# Patient Record
Sex: Female | Born: 1955 | Race: White | Hispanic: No | Smoking: Current every day smoker
Health system: Southern US, Community
[De-identification: ages and names within clinical notes are randomized; demographics above are authoritative.]

## PROBLEM LIST (undated history)

## (undated) DIAGNOSIS — F329 Major depressive disorder, single episode, unspecified: Secondary | ICD-10-CM

## (undated) DIAGNOSIS — R519 Headache, unspecified: Secondary | ICD-10-CM

## (undated) DIAGNOSIS — N951 Menopausal and female climacteric states: Secondary | ICD-10-CM

## (undated) DIAGNOSIS — E7800 Pure hypercholesterolemia, unspecified: Secondary | ICD-10-CM

## (undated) DIAGNOSIS — Z972 Presence of dental prosthetic device (complete) (partial): Secondary | ICD-10-CM

## (undated) DIAGNOSIS — E559 Vitamin D deficiency, unspecified: Secondary | ICD-10-CM

## (undated) DIAGNOSIS — F32A Depression, unspecified: Secondary | ICD-10-CM

## (undated) DIAGNOSIS — A630 Anogenital (venereal) warts: Secondary | ICD-10-CM

## (undated) DIAGNOSIS — N9089 Other specified noninflammatory disorders of vulva and perineum: Secondary | ICD-10-CM

## (undated) DIAGNOSIS — F419 Anxiety disorder, unspecified: Secondary | ICD-10-CM

## (undated) HISTORY — DX: Other specified noninflammatory disorders of vulva and perineum: N90.89

## (undated) HISTORY — DX: Vitamin D deficiency, unspecified: E55.9

## (undated) HISTORY — DX: Pure hypercholesterolemia, unspecified: E78.00

## (undated) HISTORY — PX: HERNIA REPAIR: SHX51

## (undated) HISTORY — PX: TUBAL LIGATION: SHX77

## (undated) HISTORY — DX: Anxiety disorder, unspecified: F41.9

## (undated) HISTORY — DX: Depression, unspecified: F32.A

## (undated) HISTORY — DX: Major depressive disorder, single episode, unspecified: F32.9

## (undated) HISTORY — DX: Menopausal and female climacteric states: N95.1

---
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
## 2005-05-19 ENCOUNTER — Ambulatory Visit: Payer: Self-pay

## 2006-06-29 ENCOUNTER — Ambulatory Visit: Payer: Self-pay

## 2007-07-03 ENCOUNTER — Ambulatory Visit: Payer: Self-pay

## 2008-07-07 ENCOUNTER — Ambulatory Visit: Payer: Self-pay

## 2009-07-09 ENCOUNTER — Ambulatory Visit: Payer: Self-pay

## 2010-12-09 ENCOUNTER — Ambulatory Visit: Payer: Self-pay | Admitting: Unknown Physician Specialty

## 2013-02-07 LAB — HM PAP SMEAR: HM Pap smear: NEGATIVE

## 2013-02-25 ENCOUNTER — Ambulatory Visit: Payer: Self-pay | Admitting: Obstetrics and Gynecology

## 2013-11-21 IMAGING — MG MM CAD SCREENING MAMMO
1 series · 4 of 4 positions shown · non-contrast
Comparison: none

REASON FOR EXAM: scr mammo no order
COMMENTS:

[R CC · right · 4 of 4 slices shown]
[im 1/4]
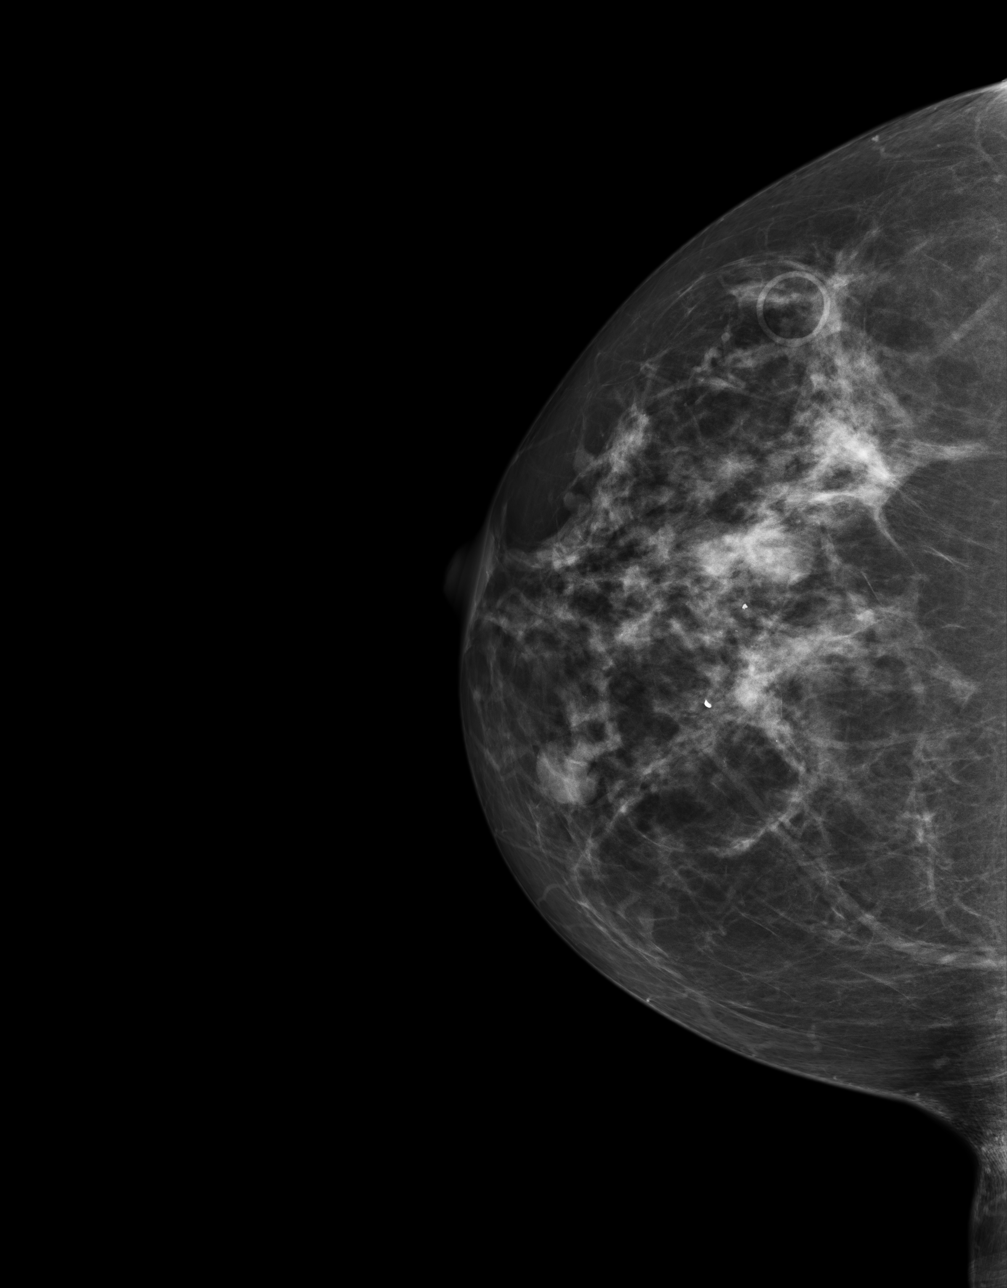
[im 2/4]
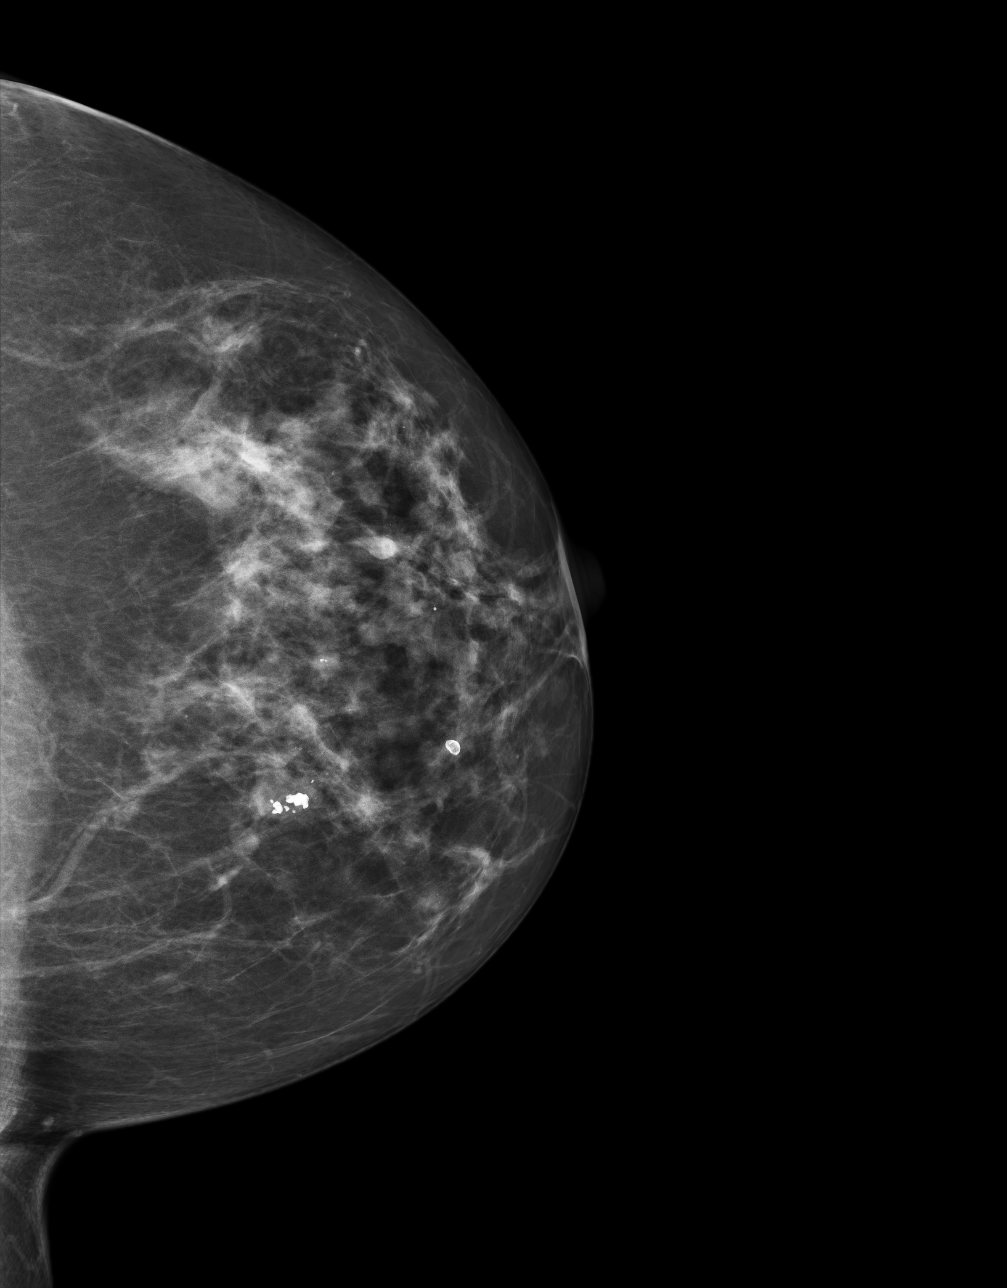
[im 3/4]
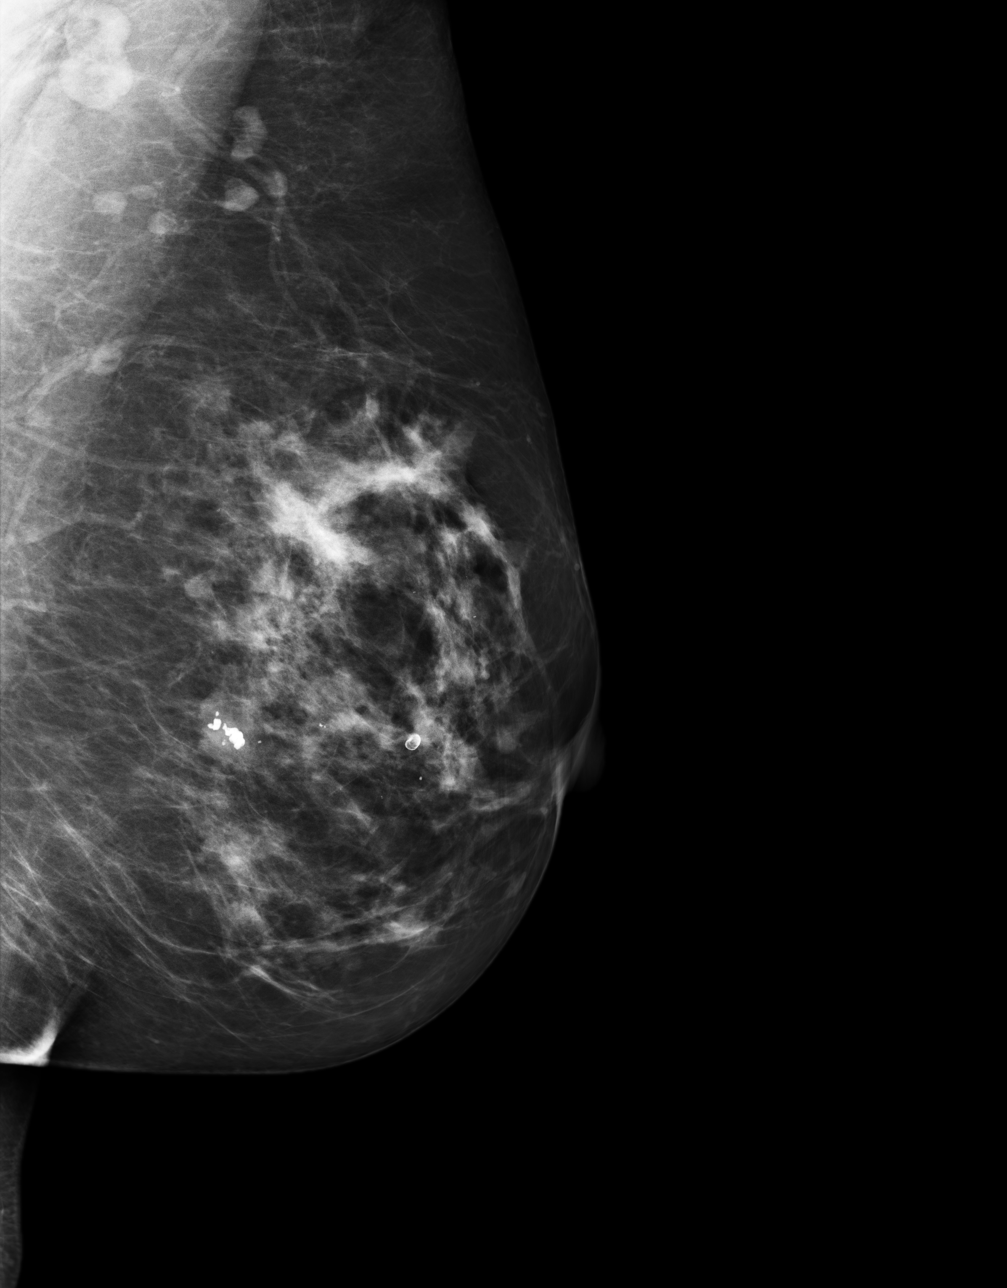
[im 4/4]
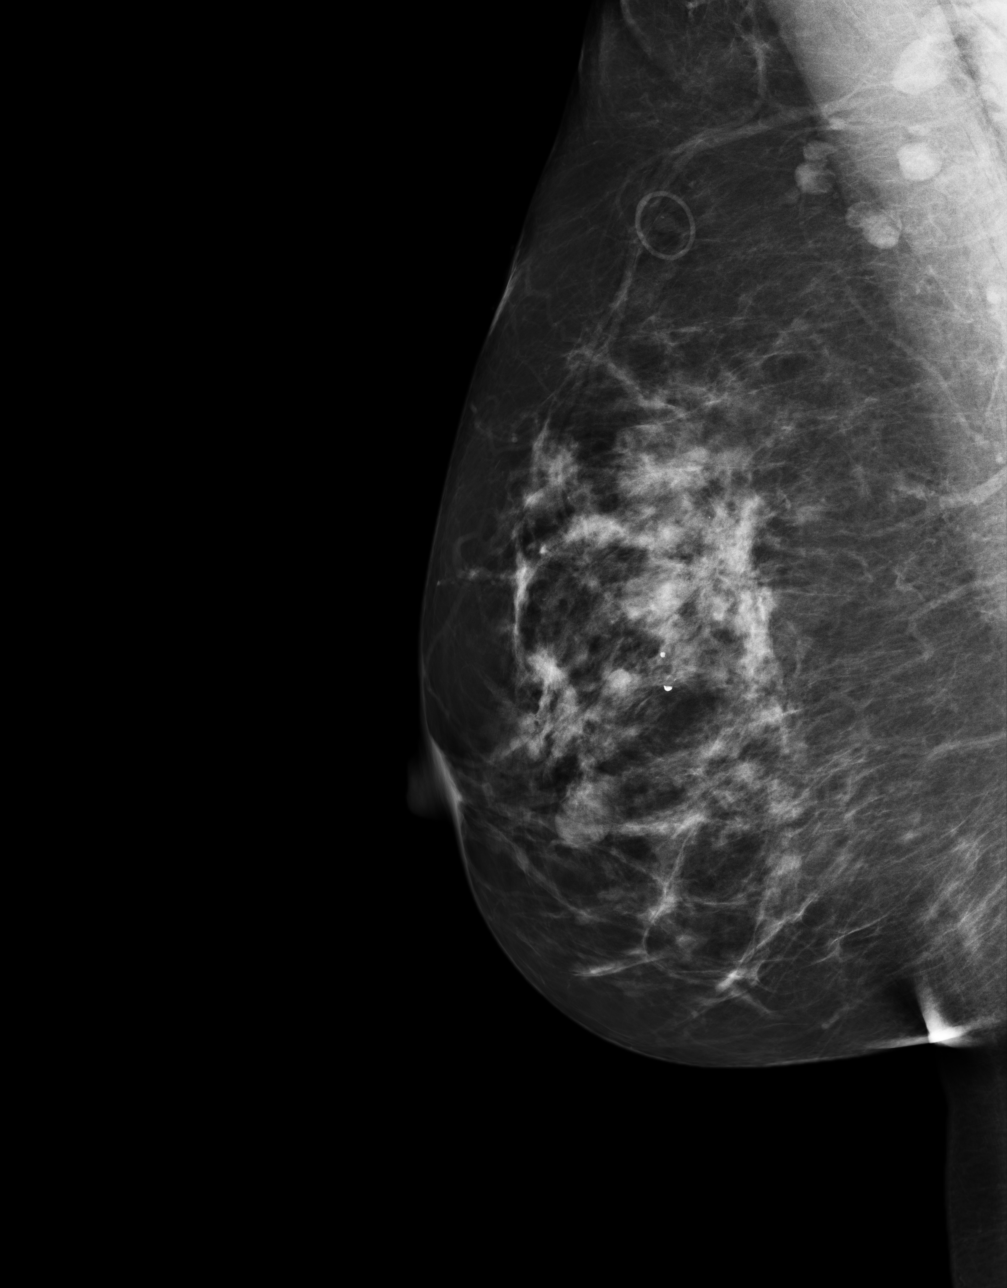

[4 of 4 positions shown; findings below may reference images not displayed]

PROCEDURE:     MAM - MAM DGTL SCRN MAM NO ORDER W/CAD  - February 25, 2013  [DATE]

RESULT:     There is no personal or family history of breast cancer. The
patient denies previous breast surgery. Comparison is made to previous
digital exams including those from to December 2010, too July 2009 and
03 July, 2007. There are benign appearing microcalcifications. There is a
heterogeneously dense pattern of parenchyma bilaterally which appears to be
stable. There is a skin nevus marker laterally in the upper outer mid to
posterior right breast. Axillary lymph nodes appear to be unchanged. Some
nodular regions are seen within the breasts themselves and also appear
unchanged. Some appear to be smaller especially in the right breast.
IMPRESSION: Benign appearing bilateral mammogram. Please continue to
encourage annual mammographic followup and monthly breast self exam. BREAST
COMPOSITION: The breast composition is HETEROGENEOUSLY DENSE (glandular
tissue is 51-75%) This may decrease the sensitivity of mammography.
BI-RADS: Category 2- Benign Finding

A NEGATIVE MAMMOGRAM REPORT DOES NOT PRECLUDE BIOPSY OR OTHER EVALUATION OF
A CLINICALLY PALPABLE OR OTHERWISE SUSPICIOUS MASS OR LESION. BREAST CANCER
MAY NOT BE DETECTED IN UP TO 10% OF CASES.

[REDACTED]

## 2015-02-17 ENCOUNTER — Ambulatory Visit
Admit: 2015-02-17 | Disposition: A | Discharge status: Home / Self Care | Payer: Self-pay | Attending: Obstetrics and Gynecology | Admitting: Obstetrics and Gynecology

## 2015-08-06 ENCOUNTER — Other Ambulatory Visit (INDEPENDENT_AMBULATORY_CARE_PROVIDER_SITE_OTHER): Payer: 59 | Admitting: Obstetrics and Gynecology

## 2015-08-06 DIAGNOSIS — R300 Dysuria: Secondary | ICD-10-CM | POA: Diagnosis not present

## 2015-08-06 MED ORDER — CIPROFLOXACIN HCL 250 MG PO TABS: 250.0000 mg | ORAL_TABLET | Freq: Two times a day (BID) | ORAL | Status: DC

## 2015-08-06 NOTE — Progress Notes (Unsigned)
Pt came in c/o burning and pressure when urinating States she doesn't want to go all weekend without medication  Cipro was sent in, culture was added

## 2015-08-08 LAB — URINE CULTURE

## 2015-08-10 ENCOUNTER — Telehealth: Payer: Self-pay | Admitting: Obstetrics and Gynecology

## 2015-08-10 NOTE — Telephone Encounter (Signed)
Pt was here last Thursday and dropped off a urine sample she was given an antibiotic, she ended it yesterday and she does feel better, she wanted to know the results of her culture and watned to know if she needs to get another antibiotic or what she took is plenty.

## 2015-08-11 ENCOUNTER — Other Ambulatory Visit: Payer: Self-pay | Admitting: *Deleted

## 2015-08-11 ENCOUNTER — Other Ambulatory Visit: Payer: Self-pay | Admitting: Obstetrics and Gynecology

## 2015-08-11 MED ORDER — CIPROFLOXACIN HCL 250 MG PO TABS: 250.0000 mg | ORAL_TABLET | Freq: Two times a day (BID) | ORAL | Status: DC

## 2015-08-11 NOTE — Telephone Encounter (Signed)
Left detailed message about pts urine culture

## 2015-08-11 NOTE — Telephone Encounter (Signed)
Please see previous note.

## 2015-08-11 NOTE — Telephone Encounter (Signed)
-----   Message from Evonnie Pat, [place] sent at 08/11/2015  1:38 PM EDT ----- Please let her know UTI was + E coli and current antibiotic showed it would treat it. To finish all medication and make sure she is starting to feel better

## 2015-08-11 NOTE — Telephone Encounter (Signed)
Left detailed message about urine culture

## 2015-08-11 NOTE — Telephone Encounter (Signed)
pls advise

## 2015-12-16 ENCOUNTER — Encounter: Payer: Self-pay | Admitting: *Deleted

## 2015-12-16 ENCOUNTER — Encounter: Payer: Self-pay | Admitting: Obstetrics and Gynecology

## 2015-12-16 ENCOUNTER — Ambulatory Visit (INDEPENDENT_AMBULATORY_CARE_PROVIDER_SITE_OTHER): Payer: 59 | Admitting: Obstetrics and Gynecology

## 2015-12-16 VITALS — BP 128/84 | HR 64 | Wt 137.1 lb

## 2015-12-16 DIAGNOSIS — M545 Low back pain: Secondary | ICD-10-CM | POA: Diagnosis not present

## 2015-12-16 DIAGNOSIS — N9489 Other specified conditions associated with female genital organs and menstrual cycle: Secondary | ICD-10-CM

## 2015-12-16 DIAGNOSIS — R102 Pelvic and perineal pain: Secondary | ICD-10-CM

## 2015-12-16 LAB — POCT URINALYSIS DIPSTICK
BILIRUBIN UA: NEGATIVE
Blood, UA: NEGATIVE
GLUCOSE UA: NEGATIVE
Ketones, UA: NEGATIVE
LEUKOCYTES UA: NEGATIVE
NITRITE UA: NEGATIVE
PH UA: 5
Protein, UA: NEGATIVE
Spec Grav, UA: 1.025
Urobilinogen, UA: 0.2

## 2015-12-16 MED ORDER — CIPROFLOXACIN HCL 500 MG PO TABS: 250.0000 mg | ORAL_TABLET | Freq: Two times a day (BID) | ORAL | Status: DC

## 2015-12-16 MED ORDER — CLONAZEPAM 0.5 MG PO TABS: 0.5000 mg | ORAL_TABLET | Freq: Two times a day (BID) | ORAL | Status: DC | PRN

## 2015-12-16 MED ORDER — UROGESIC-BLUE 81.6 MG PO TABS: 1.0000 | ORAL_TABLET | Freq: Four times a day (QID) | ORAL | Status: DC | PRN

## 2015-12-16 NOTE — Progress Notes (Signed)
Subjective:     Patient ID: Colleen Mcneil, female   DOB: [birth year], 60 y.o.   MRN: [id number]  HPI Lower pelvic bloating and pain progressively worsening x 10 days, especially last 24 hours, h/o UTIs, reports pain radiates through to low back. Better if legs elevated like in a recliner. Denies fever or dysuria  Review of Systems See above    Objective:   Physical Exam A&O x4  well groomed female in mild distress Blood pressure 128/84, pulse 64, weight 137 lb 1.6 oz (62.188 kg). Abdomen slightly tender over bladder, negative CVA Urinalysis    Component Value Date/Time   BILIRUBINUR neg 12/16/2015 1618   PROTEINUR neg 12/16/2015 1618   UROBILINOGEN 0.2 12/16/2015 1618   NITRITE neg 12/16/2015 1618   LEUKOCYTESUR Negative 12/16/2015 1618    Pelvic exam: normal external genitalia, vulva, vagina, cervix, uterus and adnexa, bladder tender on palpation.    Assessment:     Pelvic pain and pressure with radiation to low back     Plan:     RX for Cipro 500mg  bid x 7d, and urogesic Blue samples given, to stop sodas and increase water intake. Urine sent for culture, will call with results.  RTC prn  Melody Gresham, CNM

## 2015-12-19 LAB — URINE CULTURE: ORGANISM ID, BACTERIA: NO GROWTH

## 2015-12-21 ENCOUNTER — Telehealth: Payer: Self-pay | Admitting: *Deleted

## 2015-12-21 ENCOUNTER — Other Ambulatory Visit: Payer: Self-pay | Admitting: *Deleted

## 2015-12-21 MED ORDER — FLUCONAZOLE 150 MG PO TABS: 150.0000 mg | ORAL_TABLET | Freq: Once | ORAL | Status: DC

## 2015-12-21 NOTE — Telephone Encounter (Signed)
Pt called she is having pain in her low back pain, B pelvic pain, stopped Cipro 12/18/15 Wants to know what to do??

## 2015-12-22 NOTE — Telephone Encounter (Signed)
i am out of recommendation. I think she may need to be evaluated by ortho or PCP?

## 2015-12-28 NOTE — Telephone Encounter (Signed)
Notified pt she will check around for PCP

## 2016-02-23 ENCOUNTER — Encounter: Payer: Self-pay | Admitting: Obstetrics and Gynecology

## 2016-02-23 ENCOUNTER — Ambulatory Visit (INDEPENDENT_AMBULATORY_CARE_PROVIDER_SITE_OTHER): Payer: 59 | Admitting: Obstetrics and Gynecology

## 2016-02-23 VITALS — BP 138/75 | HR 74 | Ht 66.0 in | Wt 139.2 lb

## 2016-02-23 DIAGNOSIS — Z01419 Encounter for gynecological examination (general) (routine) without abnormal findings: Secondary | ICD-10-CM | POA: Diagnosis not present

## 2016-02-23 MED ORDER — CLONAZEPAM 0.5 MG PO TABS: 0.5000 mg | ORAL_TABLET | Freq: Two times a day (BID) | ORAL | Status: DC | PRN

## 2016-02-23 NOTE — Progress Notes (Signed)
Subjective:   Colleen Mcneil is a 60 y.o. No obstetric history on file. Caucasian female here for a routine well-woman exam.  No LMP recorded. Patient is postmenopausal.    Current complaints: fatigue PCP: me       does desire labs  Social History: Sexual: heterosexual Marital Status: married Living situation: with spouse Occupation: unknown occupation Tobacco/alcohol: no tobacco use Illicit drugs: no history of illicit drug use  The following portions of the patient's history were reviewed and updated as appropriate: allergies, current medications, past family history, past medical history, past social history, past surgical history and problem list.  Past Medical History Past Medical History  Diagnosis Date  . Anxiety   . Depression   . Elevated cholesterol   . Vulvar lesion   . Menopause syndrome   . Vitamin D deficiency     Past Surgical History Past Surgical History  Procedure Laterality Date  . Hernia repair    . Cesarean section  1986  . Tubal ligation      Gynecologic History No obstetric history on file.  No LMP recorded. Patient is postmenopausal. Contraception: post menopausal status Last Pap: 2015. Results were: normal Last mammogram: 2016. Results were: normal with denisty  Obstetric History OB History  No data available    Current Medications Current Outpatient Prescriptions on File Prior to Visit  Medication Sig Dispense Refill  . ciprofloxacin (CIPRO) 500 MG tablet Take 0.5 tablets (250 mg total) by mouth 2 (two) times daily. (Patient not taking: Reported on 02/23/2016) 14 tablet 1  . fluconazole (DIFLUCAN) 150 MG tablet Take 1 tablet (150 mg total) by mouth once. (Patient not taking: Reported on 02/23/2016) 1 tablet 2  . Methen-Hyosc-Meth Blue-Na Phos (UROGESIC-BLUE) 81.6 MG TABS Take 1 tablet (81.6 mg total) by mouth 4 (four) times daily as needed. (Patient not taking: Reported on 02/23/2016) 30 tablet 1   No current facility-administered  medications on file prior to visit.    Review of Systems Patient denies any headaches, blurred vision, shortness of breath, chest pain, abdominal pain, problems with bowel movements, urination, or intercourse.  Objective:  BP 138/75 mmHg  Pulse 74  Ht 5\' 6"  (1.676 m)  Wt 139 lb 3.2 oz (63.141 kg)  BMI 22.48 kg/m2 Physical Exam  General:  Well developed, well nourished, no acute distress. She is alert and oriented x3. Skin:  Warm and dry Neck:  Midline trachea, no thyromegaly or nodules Cardiovascular: Regular rate and rhythm, no murmur heard Lungs:  Effort normal, all lung fields clear to auscultation bilaterally Breasts:  No dominant palpable mass, retraction, or nipple discharge Abdomen:  Soft, non tender, no hepatosplenomegaly or masses Pelvic:  External genitalia is normal in appearance.  The vagina is normal in appearance. The cervix is bulbous, no CMT.  Thin prep pap is not done . Uterus is felt to be normal size, shape, and contour.  No adnexal masses or tenderness noted. Extremities:  No swelling or varicosities noted Psych:  She has a normal mood and affect  Assessment:   Healthy well-woman exam Fatigue   Plan:   F/U 1 year for AE, or sooner if needed Mammogram scheduled or sooner if problems Colonoscopy scheduled or sooner if problems  Shayanne Gomm Rockney Ghee, CNM

## 2016-02-23 NOTE — Patient Instructions (Signed)
  Place annual gynecologic exam patient instructions here.  Thank you for enrolling in [facility]. Please follow the instructions below to securely access your online medical record. MyChart allows you to send messages to your doctor, view your test results, manage appointments, and more.   How Do I Sign Up? 1. In your Internet browser, go to [employer] and enter [email or web address]. 2. Click on the Sign Up Now link in the Sign In box. You will see the New Member Sign Up page. 3. Enter your MyChart Access Code exactly as it appears below. You will not need to use this code after you've completed the sign-up process. If you do not sign up before the expiration date, you must request a new code.  MyChart Access Code: [email or web address] Expires: 04/23/2016  9:42 AM  4. Enter your Social Security Number ([id number]) and Date of Birth (mm/dd/yyyy) as indicated and click Submit. You will be taken to the next sign-up page. 5. Create a MyChart ID. This will be your MyChart login ID and cannot be changed, so think of one that is secure and easy to remember. 6. Create a MyChart password. You can change your password at any time. 7. Enter your Password Reset Question and Answer. This can be used at a later time if you forget your password.  8. Enter your e-mail address. You will receive e-mail notification when new information is available in [facility]. 9. Click Sign Up. You can now view your medical record.   Additional Information Remember, MyChart is NOT to be used for urgent needs. For medical emergencies, dial 911.

## 2016-02-24 ENCOUNTER — Telehealth: Payer: Self-pay | Admitting: *Deleted

## 2016-02-24 ENCOUNTER — Other Ambulatory Visit: Payer: Self-pay | Admitting: Obstetrics and Gynecology

## 2016-02-24 LAB — COMPREHENSIVE METABOLIC PANEL
ALK PHOS: 54 IU/L (ref 39–117)
ALT: 11 IU/L (ref 0–32)
AST: 15 IU/L (ref 0–40)
Albumin/Globulin Ratio: 2 (ref 1.2–2.2)
Albumin: 4.3 g/dL (ref 3.5–5.5)
BUN/Creatinine Ratio: 16 (ref 9–23)
BUN: 10 mg/dL (ref 6–24)
Bilirubin Total: 0.3 mg/dL (ref 0.0–1.2)
CALCIUM: 9.2 mg/dL (ref 8.7–10.2)
CO2: 25 mmol/L (ref 18–29)
CREATININE: 0.61 mg/dL (ref 0.57–1.00)
Chloride: 104 mmol/L (ref 96–106)
GFR calc Af Amer: 115 mL/min/{1.73_m2} (ref >59)
GFR, EST NON AFRICAN AMERICAN: 100 mL/min/{1.73_m2} (ref >59)
Globulin, Total: 2.1 g/dL (ref 1.5–4.5)
Glucose: 96 mg/dL (ref 65–99)
POTASSIUM: 4.3 mmol/L (ref 3.5–5.2)
Sodium: 143 mmol/L (ref 134–144)
Total Protein: 6.4 g/dL (ref 6.0–8.5)

## 2016-02-24 LAB — VITAMIN B12: Vitamin B-12: 295 pg/mL (ref 211–946)

## 2016-02-24 LAB — LIPID PANEL
CHOL/HDL RATIO: 4.4 ratio (ref 0.0–4.4)
CHOLESTEROL TOTAL: 190 mg/dL (ref 100–199)
HDL: 43 mg/dL (ref >39)
LDL CALC: 120 mg/dL — AB (ref 0–99)
TRIGLYCERIDES: 134 mg/dL (ref 0–149)
VLDL Cholesterol Cal: 27 mg/dL (ref 5–40)

## 2016-02-24 LAB — VITAMIN D 25 HYDROXY (VIT D DEFICIENCY, FRACTURES): Vit D, 25-Hydroxy: 20.2 ng/mL — ABNORMAL LOW (ref 30.0–100.0)

## 2016-02-24 MED ORDER — VITAMIN D (ERGOCALCIFEROL) 1.25 MG (50000 UNIT) PO CAPS: 50000.0000 [IU] | ORAL_CAPSULE | ORAL | Status: DC

## 2016-02-24 NOTE — Telephone Encounter (Signed)
Mailed pt all info about lab results

## 2016-02-24 NOTE — Telephone Encounter (Signed)
-----   Message from Joylene Igo, [place] sent at 02/24/2016  3:05 PM EDT ----- Please mail info on vit D def

## 2016-03-29 ENCOUNTER — Telehealth: Payer: Self-pay

## 2016-03-29 ENCOUNTER — Other Ambulatory Visit: Payer: Self-pay

## 2016-03-29 NOTE — Telephone Encounter (Signed)
Gastroenterology Pre-Procedure Review  Request Date: 04/29/16 Requesting Physician:   PATIENT REVIEW QUESTIONS: The patient responded to the following health history questions as indicated:    1. Are you having any GI issues? no 2. Do you have a personal history of Polyps? no 3. Do you have a family history of Colon Cancer or Polyps? no 4. Diabetes Mellitus? no 5. Joint replacements in the past 12 months?no 6. Major health problems in the past 3 months?no 7. Any artificial heart valves, MVP, or defibrillator?no    MEDICATIONS & ALLERGIES:    Patient reports the following regarding taking any anticoagulation/antiplatelet therapy:   Plavix, Coumadin, Eliquis, Xarelto, Lovenox, Pradaxa, Brilinta, or Effient? no Aspirin? no  Patient confirms/reports the following medications:  Current Outpatient Prescriptions  Medication Sig Dispense Refill  . ciprofloxacin (CIPRO) 500 MG tablet Take 0.5 tablets (250 mg total) by mouth 2 (two) times daily. (Patient not taking: Reported on 02/23/2016) 14 tablet 1  . clonazePAM (KLONOPIN) 0.5 MG tablet Take 1 tablet (0.5 mg total) by mouth 2 (two) times daily as needed for anxiety. 60 tablet 2  . fluconazole (DIFLUCAN) 150 MG tablet Take 1 tablet (150 mg total) by mouth once. (Patient not taking: Reported on 02/23/2016) 1 tablet 2  . Methen-Hyosc-Meth Blue-Na Phos (UROGESIC-BLUE) 81.6 MG TABS Take 1 tablet (81.6 mg total) by mouth 4 (four) times daily as needed. (Patient not taking: Reported on 02/23/2016) 30 tablet 1  . Vitamin D, Ergocalciferol, (DRISDOL) 50000 units CAPS capsule Take 1 capsule (50,000 Units total) by mouth 2 (two) times a week. 30 capsule 1   No current facility-administered medications for this visit.    Patient confirms/reports the following allergies:  Allergies  Allergen Reactions  . Macrobid [Nitrofurantoin Monohyd Macro]     No orders of the defined types were placed in this encounter.    AUTHORIZATION INFORMATION Primary  Insurance: 1D#: Group #:  Secondary Insurance: 1D#: Group #:  SCHEDULE INFORMATION: Date: 04/29/16 Time: Location: [place]

## 2016-04-19 ENCOUNTER — Telehealth: Payer: Self-pay

## 2016-04-19 NOTE — Telephone Encounter (Signed)
Patient called stating that she had called her insurance company to make sure that her colonoscopy would be covered. She wanted to know if Dr. Allen Norris was in network with [facility], and I told her that he was. She also wanted to know that if her colonoscopy was set-up to be preventative, I told her that it was. She also wanted to make sure if her colonoscopy was still set-up for 04/29/2016, I told her that it was. Patient had no further questions or concerns.

## 2016-04-29 ENCOUNTER — Ambulatory Visit: Admit: 2016-04-29 | Payer: Self-pay | Admitting: Gastroenterology

## 2016-04-29 SURGERY — COLONOSCOPY WITH PROPOFOL
Anesthesia: Choice

## 2016-05-19 ENCOUNTER — Other Ambulatory Visit: Payer: Self-pay

## 2016-05-19 ENCOUNTER — Encounter: Payer: Self-pay | Admitting: *Deleted

## 2016-05-20 ENCOUNTER — Ambulatory Visit: Payer: Commercial Managed Care - HMO | Admitting: Anesthesiology

## 2016-05-20 ENCOUNTER — Ambulatory Visit
Admission: RE | Admit: 2016-05-20 | Discharge: 2016-05-20 | Disposition: A | Discharge status: Home / Self Care | Payer: Commercial Managed Care - HMO | Source: Ambulatory Visit | Attending: Gastroenterology | Admitting: Gastroenterology

## 2016-05-20 ENCOUNTER — Encounter
Admission: RE | Disposition: A | Discharge status: Home / Self Care | Payer: Self-pay | Source: Ambulatory Visit | Attending: Gastroenterology

## 2016-05-20 DIAGNOSIS — Z881 Allergy status to other antibiotic agents status: Secondary | ICD-10-CM | POA: Insufficient documentation

## 2016-05-20 DIAGNOSIS — F419 Anxiety disorder, unspecified: Secondary | ICD-10-CM | POA: Diagnosis not present

## 2016-05-20 DIAGNOSIS — Z9889 Other specified postprocedural states: Secondary | ICD-10-CM | POA: Diagnosis not present

## 2016-05-20 DIAGNOSIS — F17210 Nicotine dependence, cigarettes, uncomplicated: Secondary | ICD-10-CM | POA: Insufficient documentation

## 2016-05-20 DIAGNOSIS — Z1211 Encounter for screening for malignant neoplasm of colon: Secondary | ICD-10-CM | POA: Insufficient documentation

## 2016-05-20 DIAGNOSIS — Z9851 Tubal ligation status: Secondary | ICD-10-CM | POA: Diagnosis not present

## 2016-05-20 DIAGNOSIS — K640 First degree hemorrhoids: Secondary | ICD-10-CM | POA: Diagnosis not present

## 2016-05-20 DIAGNOSIS — Z833 Family history of diabetes mellitus: Secondary | ICD-10-CM | POA: Insufficient documentation

## 2016-05-20 DIAGNOSIS — K635 Polyp of colon: Secondary | ICD-10-CM | POA: Insufficient documentation

## 2016-05-20 DIAGNOSIS — D125 Benign neoplasm of sigmoid colon: Secondary | ICD-10-CM | POA: Insufficient documentation

## 2016-05-20 DIAGNOSIS — Z8249 Family history of ischemic heart disease and other diseases of the circulatory system: Secondary | ICD-10-CM | POA: Diagnosis not present

## 2016-05-20 DIAGNOSIS — F329 Major depressive disorder, single episode, unspecified: Secondary | ICD-10-CM | POA: Insufficient documentation

## 2016-05-20 DIAGNOSIS — Z79899 Other long term (current) drug therapy: Secondary | ICD-10-CM | POA: Insufficient documentation

## 2016-05-20 DIAGNOSIS — K5730 Diverticulosis of large intestine without perforation or abscess without bleeding: Secondary | ICD-10-CM | POA: Diagnosis not present

## 2016-05-20 DIAGNOSIS — E7800 Pure hypercholesterolemia, unspecified: Secondary | ICD-10-CM | POA: Insufficient documentation

## 2016-05-20 DIAGNOSIS — E559 Vitamin D deficiency, unspecified: Secondary | ICD-10-CM | POA: Diagnosis not present

## 2016-05-20 HISTORY — PX: COLONOSCOPY WITH PROPOFOL: SHX5780

## 2016-05-20 HISTORY — PX: POLYPECTOMY: SHX5525

## 2016-05-20 SURGERY — COLONOSCOPY WITH PROPOFOL
Anesthesia: Monitor Anesthesia Care | Wound class: Contaminated

## 2016-05-20 MED ORDER — LACTATED RINGERS IV SOLN
INTRAVENOUS | Status: DC
  Administered 2016-05-20: 08:00:00 via INTRAVENOUS

## 2016-05-20 MED ORDER — DEXAMETHASONE SODIUM PHOSPHATE 4 MG/ML IJ SOLN: 8.0000 mg | Freq: Once | INTRAMUSCULAR | Status: DC | PRN

## 2016-05-20 MED ORDER — FENTANYL CITRATE (PF) 100 MCG/2ML IJ SOLN
25.0000 ug | INTRAMUSCULAR | Status: DC | PRN
Start: 2016-05-20 — End: 2016-05-20

## 2016-05-20 MED ORDER — PROPOFOL 10 MG/ML IV BOLUS
INTRAVENOUS | Status: DC | PRN
  Administered 2016-05-20: 20 mg via INTRAVENOUS
  Administered 2016-05-20: 10 mg via INTRAVENOUS
  Administered 2016-05-20 (×2): 20 mg via INTRAVENOUS
  Administered 2016-05-20: 70 mg via INTRAVENOUS
  Administered 2016-05-20: 10 mg via INTRAVENOUS
  Administered 2016-05-20 (×2): 20 mg via INTRAVENOUS
  Administered 2016-05-20: 10 mg via INTRAVENOUS
  Administered 2016-05-20 (×2): 20 mg via INTRAVENOUS

## 2016-05-20 MED ORDER — LIDOCAINE HCL (CARDIAC) 20 MG/ML IV SOLN
INTRAVENOUS | Status: DC | PRN
  Administered 2016-05-20: 50 mg via INTRAVENOUS

## 2016-05-20 MED ORDER — ACETAMINOPHEN 325 MG PO TABS: 325.0000 mg | ORAL_TABLET | ORAL | Status: DC | PRN

## 2016-05-20 MED ORDER — OXYCODONE HCL 5 MG/5ML PO SOLN: 5.0000 mg | Freq: Once | ORAL | Status: DC | PRN

## 2016-05-20 MED ORDER — ACETAMINOPHEN 160 MG/5ML PO SOLN: 325.0000 mg | ORAL | Status: DC | PRN

## 2016-05-20 MED ORDER — STERILE WATER FOR IRRIGATION IR SOLN
Status: DC | PRN
  Administered 2016-05-20: 09:00:00

## 2016-05-20 MED ORDER — OXYCODONE HCL 5 MG PO TABS: 5.0000 mg | ORAL_TABLET | Freq: Once | ORAL | Status: DC | PRN

## 2016-05-20 SURGICAL SUPPLY — 23 items
CANISTER SUCT 1200ML W/VALVE (MISCELLANEOUS) ×4 IMPLANT
CLIP HMST 235XBRD CATH ROT (MISCELLANEOUS) IMPLANT
CLIP RESOLUTION 360 11X235 (MISCELLANEOUS)
FCP ESCP3.2XJMB 240X2.8X (MISCELLANEOUS)
FORCEPS BIOP RAD 4 LRG CAP 4 (CUTTING FORCEPS) ×4 IMPLANT
FORCEPS BIOP RJ4 240 W/NDL (MISCELLANEOUS)
FORCEPS ESCP3.2XJMB 240X2.8X (MISCELLANEOUS) IMPLANT
GOWN CVR UNV OPN BCK APRN NK (MISCELLANEOUS) ×4 IMPLANT
GOWN ISOL THUMB LOOP REG UNIV (MISCELLANEOUS) ×4
INJECTOR VARIJECT VIN23 (MISCELLANEOUS) IMPLANT
KIT DEFENDO VALVE AND CONN (KITS) IMPLANT
KIT ENDO PROCEDURE OLY (KITS) ×4 IMPLANT
MARKER SPOT ENDO TATTOO 5ML (MISCELLANEOUS) IMPLANT
PAD GROUND ADULT SPLIT (MISCELLANEOUS) IMPLANT
PROBE APC STR FIRE (PROBE) IMPLANT
RETRIEVER NET ROTH 2.5X230 LF (MISCELLANEOUS) ×4 IMPLANT
SNARE SHORT THROW 13M SML OVAL (MISCELLANEOUS) ×4 IMPLANT
SNARE SHORT THROW 30M LRG OVAL (MISCELLANEOUS) IMPLANT
SNARE SNG USE RND 15MM (INSTRUMENTS) IMPLANT
SPOT EX ENDOSCOPIC TATTOO (MISCELLANEOUS)
TRAP ETRAP POLY (MISCELLANEOUS) ×4 IMPLANT
VARIJECT INJECTOR VIN23 (MISCELLANEOUS)
WATER STERILE IRR 250ML POUR (IV SOLUTION) ×4 IMPLANT

## 2016-05-20 NOTE — Op Note (Signed)
[facility] Patient Name: Colleen Mcneil Procedure Date: 05/20/2016 8:21 AM MRN: [id number] Account #: [id number] Date of Birth: 1956/01/20 Admit Type: Outpatient Age: 60 Room: [facility] OR ROOM 01 Gender: Female Note Status: Finalized Procedure:            Colonoscopy Indications:          Screening for colorectal malignant neoplasm Providers:            Lucilla Lame MD, MD Referring MD:         Evonnie Pat (Referring MD) Medicines:            Propofol per Anesthesia Complications:        No immediate complications. Procedure:            Pre-Anesthesia Assessment:                       - Prior to the procedure, a History and Physical was                        performed, and patient medications and allergies were                        reviewed. The patient's tolerance of previous                        anesthesia was also reviewed. The risks and benefits of                        the procedure and the sedation options and risks were                        discussed with the patient. All questions were                        answered, and informed consent was obtained. Prior                        Anticoagulants: The patient has taken no previous                        anticoagulant or antiplatelet agents. ASA Grade                        Assessment: II - A patient with mild systemic disease.                        After reviewing the risks and benefits, the patient was                        deemed in satisfactory condition to undergo the                        procedure.                       After obtaining informed consent, the colonoscope was                        passed under direct vision. Throughout the procedure,  the patient's blood pressure, pulse, and oxygen                        saturations were monitored continuously. The was                        introduced through the anus and advanced to the the                         cecum, identified by appendiceal orifice and ileocecal                        valve. The colonoscopy was performed without                        difficulty. The patient tolerated the procedure well.                        The quality of the bowel preparation was excellent. Findings:      The perianal and digital rectal examinations were normal.      Six sessile polyps were found in the sigmoid colon. The polyps were 4 to       7 mm in size. These polyps were removed with a cold snare. Resection and       retrieval were complete.      Non-bleeding internal hemorrhoids were found during retroflexion. The       hemorrhoids were Grade I (internal hemorrhoids that do not prolapse).      Multiple small-mouthed diverticula were found in the sigmoid colon. Impression:           - Six 4 to 7 mm polyps in the sigmoid colon, removed                        with a cold snare. Resected and retrieved.                       - Non-bleeding internal hemorrhoids.                       - Diverticulosis in the sigmoid colon. Recommendation:       - Await pathology results.                       - Repeat colonoscopy in 5 years if polyp adenoma and 10                        years if hyperplastic Procedure Code(s):    --- Professional ---                       [phone], Colonoscopy, flexible; with removal of tumor(s),                        polyp(s), or other lesion(s) by snare technique Diagnosis Code(s):    --- Professional ---                       Z12.11, Encounter for screening for malignant neoplasm                        of colon  D12.5, Benign neoplasm of sigmoid colon CPT copyright 2016 American Medical Association. All rights reserved. The codes documented in this report are preliminary and upon coder review may  be revised to meet current compliance requirements. Lucilla Lame MD, MD 05/20/2016 8:59:16 AM This report has been signed electronically. Number of Addenda:  0 Note Initiated On: 05/20/2016 8:21 AM Scope Withdrawal Time: 0 hours 6 minutes 17 seconds  Total Procedure Duration: 0 hours 16 minutes 15 seconds       [facility]

## 2016-05-20 NOTE — Anesthesia Preprocedure Evaluation (Signed)
Anesthesia Evaluation  Patient identified by MRN, date of birth, ID band Patient awake    Reviewed: Allergy & Precautions, H&P , NPO status , Patient's Chart, lab work & pertinent test results, reviewed documented beta blocker date and time   Airway Mallampati: II  TM Distance: >3 FB Neck ROM: full    Dental no notable dental hx.    Pulmonary neg pulmonary ROS, Current Smoker,    Pulmonary exam normal breath sounds clear to auscultation       Cardiovascular Exercise Tolerance: Good negative cardio ROS   Rhythm:regular Rate:Normal     Neuro/Psych negative neurological ROS  negative psych ROS   GI/Hepatic negative GI ROS, Neg liver ROS,   Endo/Other  negative endocrine ROS  Renal/GU negative Renal ROS  negative genitourinary   Musculoskeletal   Abdominal   Peds  Hematology negative hematology ROS (+)   Anesthesia Other Findings   Reproductive/Obstetrics negative OB ROS                             Anesthesia Physical Anesthesia Plan  ASA: I  Anesthesia Plan: MAC   Post-op Pain Management:    Induction:   Airway Management Planned:   Additional Equipment:   Intra-op Plan:   Post-operative Plan:   Informed Consent: I have reviewed the patients History and Physical, chart, labs and discussed the procedure including the risks, benefits and alternatives for the proposed anesthesia with the patient or authorized representative who has indicated his/her understanding and acceptance.     Plan Discussed with: CRNA  Anesthesia Plan Comments:         Anesthesia Quick Evaluation

## 2016-05-20 NOTE — Transfer of Care (Signed)
Immediate Anesthesia Transfer of Care Note  Patient: Colleen Mcneil  Procedure(s) Performed: Procedure(s): COLONOSCOPY WITH PROPOFOL (N/A) POLYPECTOMY  Patient Location: PACU  Anesthesia Type: MAC  Level of Consciousness: awake, alert  and patient cooperative  Airway and Oxygen Therapy: Patient Spontanous Breathing and Patient connected to supplemental oxygen  Post-op Assessment: Post-op Vital signs reviewed, Patient's Cardiovascular Status Stable, Respiratory Function Stable, Patent Airway and No signs of Nausea or vomiting  Post-op Vital Signs: Reviewed and stable  Complications: No apparent anesthesia complications

## 2016-05-20 NOTE — Anesthesia Procedure Notes (Signed)
Procedure Name: MAC Performed by: Asiel Chrostowski Pre-anesthesia Checklist: Patient identified, Emergency Drugs available, Suction available, Timeout performed and Patient being monitored Patient Re-evaluated:Patient Re-evaluated prior to inductionOxygen Delivery Method: Nasal cannula Placement Confirmation: positive ETCO2       

## 2016-05-20 NOTE — Anesthesia Postprocedure Evaluation (Signed)
Anesthesia Post Note  Patient: Colleen Mcneil  Procedure(s) Performed: Procedure(s) (LRB): COLONOSCOPY WITH PROPOFOL (N/A) POLYPECTOMY  Patient location during evaluation: PACU Anesthesia Type: MAC Level of consciousness: awake and alert Pain management: pain level controlled Vital Signs Assessment: post-procedure vital signs reviewed and stable Respiratory status: spontaneous breathing, nonlabored ventilation and respiratory function stable Cardiovascular status: blood pressure returned to baseline and stable Postop Assessment: no signs of nausea or vomiting Anesthetic complications: no    Valton Schwartz D Saifullah Jolley

## 2016-05-20 NOTE — Discharge Instructions (Signed)

## 2016-05-20 NOTE — H&P (Signed)
  Colleen Lame, MD [facility] [address], Colleen Mcneil, [place] [address] Phone: [phone] Fax : [phone]  Primary Care Physician:  [facility] Primary Gastroenterologist:  Dr. Allen Norris  Pre-Procedure History & Physical: HPI:  Colleen Mcneil is a 60 y.o. female is here for a screening colonoscopy.   Past Medical History  Diagnosis Date  . Anxiety   . Depression   . Elevated cholesterol   . Vulvar lesion   . Menopause syndrome   . Vitamin D deficiency     Past Surgical History  Procedure Laterality Date  . Hernia repair    . Cesarean section  1986  . Tubal ligation      Prior to Admission medications   Medication Sig Start Date End Date Taking? Authorizing Provider  clonazePAM (KLONOPIN) 0.5 MG tablet Take 1 tablet (0.5 mg total) by mouth 2 (two) times daily as needed for anxiety. 02/23/16  Yes Melody N Shambley, CNM  Vitamin D, Ergocalciferol, (DRISDOL) 50000 units CAPS capsule Take 1 capsule (50,000 Units total) by mouth 2 (two) times a week. 02/24/16  Yes Melody N Shambley, CNM    Allergies as of 05/19/2016 - Review Complete 05/19/2016  Allergen Reaction Noted  . Macrobid [nitrofurantoin monohyd macro] Nausea And Vomiting 08/06/2015    Family History  Problem Relation Age of Onset  . Heart disease Mother   . Diabetes Mother   . Diabetes Father     Social History   Social History  . Marital Status: Married    Spouse Name: N/A  . Number of Children: N/A  . Years of Education: N/A   Occupational History  . Not on file.   Social History Main Topics  . Smoking status: Current Every Day Smoker -- 0.50 packs/day for 25 years    Types: Cigarettes  . Smokeless tobacco: Never Used  . Alcohol Use: No  . Drug Use: No  . Sexual Activity: Not Currently   Other Topics Concern  . Not on file   Social History Narrative    Review of Systems: See HPI, otherwise negative ROS  Physical Exam: BP 102/71 mmHg  Pulse 65  Temp(Src)  97.7 F (36.5 C)  Resp 16  Ht 5\' 6"  (1.676 m)  Wt 135 lb (61.236 kg)  BMI 21.80 kg/m2  SpO2 99% General:   Alert,  pleasant and cooperative in NAD Head:  Normocephalic and atraumatic. Neck:  Supple; no masses or thyromegaly. Lungs:  Clear throughout to auscultation.    Heart:  Regular rate and rhythm. Abdomen:  Soft, nontender and nondistended. Normal bowel sounds, without guarding, and without rebound.   Neurologic:  Alert and  oriented x4;  grossly normal neurologically.  Impression/Plan: [name] is now here to undergo a screening colonoscopy.  Risks, benefits, and alternatives regarding colonoscopy have been reviewed with the patient.  Questions have been answered.  All parties agreeable.

## 2016-05-23 ENCOUNTER — Encounter: Payer: Self-pay | Admitting: Gastroenterology

## 2016-05-24 ENCOUNTER — Encounter: Payer: Self-pay | Admitting: Gastroenterology

## 2016-05-25 ENCOUNTER — Encounter: Payer: Self-pay | Admitting: Gastroenterology

## 2016-11-25 ENCOUNTER — Telehealth: Payer: Self-pay | Admitting: Obstetrics and Gynecology

## 2016-11-25 NOTE — Telephone Encounter (Signed)
THE PHARMACY SENT A REQUEST FOR MEDICATION, SHE NEEDS IT THIS W/E. YOU PROBABLY ALREADY HAVE IT.

## 2017-02-23 ENCOUNTER — Telehealth: Payer: Self-pay | Admitting: Obstetrics and Gynecology

## 2017-02-23 ENCOUNTER — Encounter: Payer: 59 | Admitting: Obstetrics and Gynecology

## 2017-02-23 NOTE — Telephone Encounter (Signed)
Per mad-- pt aware he will not refill at this time. [facility] will have to fill. Pt has an appt on 4/23.

## 2017-02-23 NOTE — Telephone Encounter (Signed)
Patient called stating she needs a refill on klonopin. She was scheduled to see Melody today but had to be rescheduled due to her being out. Thanks

## 2017-02-27 ENCOUNTER — Ambulatory Visit (INDEPENDENT_AMBULATORY_CARE_PROVIDER_SITE_OTHER): Payer: 59 | Admitting: Obstetrics and Gynecology

## 2017-02-27 ENCOUNTER — Other Ambulatory Visit: Payer: Self-pay | Admitting: Obstetrics and Gynecology

## 2017-02-27 ENCOUNTER — Encounter: Payer: Self-pay | Admitting: Obstetrics and Gynecology

## 2017-02-27 VITALS — BP 113/71 | HR 70 | Ht 66.0 in | Wt 133.5 lb

## 2017-02-27 DIAGNOSIS — L821 Other seborrheic keratosis: Secondary | ICD-10-CM | POA: Diagnosis not present

## 2017-02-27 DIAGNOSIS — Z01419 Encounter for gynecological examination (general) (routine) without abnormal findings: Secondary | ICD-10-CM

## 2017-02-27 DIAGNOSIS — E559 Vitamin D deficiency, unspecified: Secondary | ICD-10-CM

## 2017-02-27 DIAGNOSIS — F17200 Nicotine dependence, unspecified, uncomplicated: Secondary | ICD-10-CM | POA: Diagnosis not present

## 2017-02-27 MED ORDER — PODOFILOX 0.5 % EX GEL: Freq: Every evening | CUTANEOUS | 2 refills | Status: DC

## 2017-02-27 MED ORDER — CLONAZEPAM 1 MG PO TABS: 0.5000 mg | ORAL_TABLET | Freq: Two times a day (BID) | ORAL | 4 refills | Status: DC | PRN

## 2017-02-27 MED ORDER — VITAMIN D (ERGOCALCIFEROL) 1.25 MG (50000 UNIT) PO CAPS: 50000.0000 [IU] | ORAL_CAPSULE | ORAL | 1 refills | Status: DC

## 2017-02-27 MED ORDER — VITAMIN D3 125 MCG (5000 UT) PO CAPS: 1.0000 | ORAL_CAPSULE | Freq: Every day | ORAL | 2 refills | Status: DC

## 2017-02-27 NOTE — Progress Notes (Signed)
Subjective:   Colleen Mcneil is a 61 y.o. G2P3 Caucasian female here for a routine well-woman exam.  No LMP recorded. Patient is postmenopausal.    Current complaints: stress PCP: ?       does desire labs  Social History: Sexual: heterosexual Marital Status: married Living situation: with spouse Occupation: Research scientist (physical sciences) Tobacco/alcohol: tobacco use: Smoked 1 packs per day for >16 years Illicit drugs: no history of illicit drug use  The following portions of the patient's history were reviewed and updated as appropriate: allergies, current medications, past family history, past medical history, past social history, past surgical history and problem list.  Past Medical History Past Medical History:  Diagnosis Date  . Anxiety   . Depression   . Elevated cholesterol   . Menopause syndrome   . Vitamin D deficiency   . Vulvar lesion     Past Surgical History Past Surgical History:  Procedure Laterality Date  . CESAREAN SECTION  1986  . COLONOSCOPY WITH PROPOFOL N/A 05/20/2016   Procedure: COLONOSCOPY WITH PROPOFOL;  Surgeon: Lucilla Lame, MD;  Location: [place];  Service: Endoscopy;  Laterality: N/A;  . HERNIA REPAIR    . POLYPECTOMY  05/20/2016   Procedure: POLYPECTOMY;  Surgeon: Lucilla Lame, MD;  Location: [place];  Service: Endoscopy;;  . TUBAL LIGATION      Gynecologic History G3P3  No LMP recorded. Patient is postmenopausal. Contraception: abstinence Last Pap: 2013. Results were: normal Last mammogram: 2017. Results were: normal  Obstetric History OB History  Gravida Para Term Preterm AB Living  3 3          SAB TAB Ectopic Multiple Live Births               # Outcome Date GA Lbr Len/2nd Weight Sex Delivery Anes PTL Lv  3 Para 87    M CS-Unspec     2 Para 1982    F Vag-Spont     1 Para 1981    M Vag-Spont         Current Medications Current Outpatient Prescriptions on File Prior to Visit  Medication Sig Dispense Refill  .  clonazePAM (KLONOPIN) 0.5 MG tablet Take 1 tablet (0.5 mg total) by mouth 2 (two) times daily as needed for anxiety. 60 tablet 2  . Vitamin D, Ergocalciferol, (DRISDOL) 50000 units CAPS capsule Take 1 capsule (50,000 Units total) by mouth 2 (two) times a week. (Patient not taking: Reported on 02/27/2017) 30 capsule 1   No current facility-administered medications on file prior to visit.     Review of Systems Patient denies any headaches, blurred vision, shortness of breath, chest pain, abdominal pain, problems with bowel movements, urination, or intercourse.  Objective:  BP 113/71   Pulse 70   Ht 5\' 6"  (1.676 m)   Wt 133 lb 8 oz (60.6 kg)   BMI 21.55 kg/m  Physical Exam  General:  Well developed, well nourished, no acute distress. She is alert and oriented x3. Skin:  Warm and dry Neck:  Midline trachea, no thyromegaly or nodules Cardiovascular: Regular rate and rhythm, no murmur heard Lungs:  Effort normal, all lung fields clear to auscultation bilaterally Breasts:  No dominant palpable mass, retraction, or nipple discharge Abdomen:  Soft, non tender, no hepatosplenomegaly or masses Pelvic:  External genitalia is normal in appearance except for seborrheic keratosis on left perineum/buttock that is larger and now with comdyloma growth on prerineum and at top of rectum.  The vagina is  normal in appearance. The cervix is bulbous, no CMT.  Thin prep pap is done with HR HPV cotesting. Uterus is felt to be normal size, shape, and contour.  No adnexal masses or tenderness noted. Rectal: Good sphincter tone, no polyps, or hemorrhoids felt.   Extremities:  No swelling or varicosities noted Psych:  She has a normal mood and affect  Assessment:   Healthy well-woman exam Anxiety Long term smoker Perineal changes with long term seborrheic keratosis Vitamin d deficiency  Plan:  Will try condylox gel qohs for 8 weeks if tolerated and then re-evaluated by Dr Marcelline Mates- may need surgical  removal. Can't tolerate weekly vit D supplement, will lower dose and try daily supplement. F/U 1 year for Ae, or sooner if needed Mammogram ordered or sooner if problems Colonoscopy not due Screening CT ordered   Lowella Kindley Rockney Ghee, CNM

## 2017-02-28 ENCOUNTER — Other Ambulatory Visit: Payer: Self-pay | Admitting: Obstetrics and Gynecology

## 2017-02-28 ENCOUNTER — Ambulatory Visit
Admission: RE | Admit: 2017-02-28 | Discharge: 2017-02-28 | Disposition: A | Discharge status: Home / Self Care | Payer: 59 | Source: Ambulatory Visit | Attending: Obstetrics and Gynecology | Admitting: Obstetrics and Gynecology

## 2017-02-28 ENCOUNTER — Telehealth: Payer: Self-pay | Admitting: *Deleted

## 2017-02-28 DIAGNOSIS — Z1231 Encounter for screening mammogram for malignant neoplasm of breast: Secondary | ICD-10-CM | POA: Insufficient documentation

## 2017-02-28 DIAGNOSIS — Z01419 Encounter for gynecological examination (general) (routine) without abnormal findings: Secondary | ICD-10-CM

## 2017-02-28 LAB — LIPID PANEL
CHOLESTEROL TOTAL: 195 mg/dL (ref 100–199)
Chol/HDL Ratio: 4.4 ratio (ref 0.0–4.4)
HDL: 44 mg/dL (ref >39)
LDL CALC: 126 mg/dL — AB (ref 0–99)
TRIGLYCERIDES: 127 mg/dL (ref 0–149)
VLDL CHOLESTEROL CAL: 25 mg/dL (ref 5–40)

## 2017-02-28 LAB — COMPREHENSIVE METABOLIC PANEL
ALBUMIN: 4.5 g/dL (ref 3.6–4.8)
ALK PHOS: 51 IU/L (ref 39–117)
ALT: 8 IU/L (ref 0–32)
AST: 11 IU/L (ref 0–40)
Albumin/Globulin Ratio: 2.1 (ref 1.2–2.2)
BILIRUBIN TOTAL: 0.2 mg/dL (ref 0.0–1.2)
BUN/Creatinine Ratio: 21 (ref 12–28)
BUN: 13 mg/dL (ref 8–27)
CO2: 25 mmol/L (ref 18–29)
CREATININE: 0.61 mg/dL (ref 0.57–1.00)
Calcium: 9.1 mg/dL (ref 8.7–10.3)
Chloride: 107 mmol/L — ABNORMAL HIGH (ref 96–106)
GFR calc Af Amer: 114 mL/min/{1.73_m2} (ref >59)
GFR calc non Af Amer: 99 mL/min/{1.73_m2} (ref >59)
GLUCOSE: 104 mg/dL — AB (ref 65–99)
Globulin, Total: 2.1 g/dL (ref 1.5–4.5)
Potassium: 4.5 mmol/L (ref 3.5–5.2)
Sodium: 145 mmol/L — ABNORMAL HIGH (ref 134–144)
Total Protein: 6.6 g/dL (ref 6.0–8.5)

## 2017-02-28 LAB — VITAMIN D 25 HYDROXY (VIT D DEFICIENCY, FRACTURES): Vit D, 25-Hydroxy: 29.5 ng/mL — ABNORMAL LOW (ref 30.0–100.0)

## 2017-02-28 MED ORDER — CLONAZEPAM 1 MG PO TABS: 1.0000 mg | ORAL_TABLET | Freq: Two times a day (BID) | ORAL | 4 refills | Status: DC | PRN

## 2017-02-28 NOTE — Telephone Encounter (Signed)
-----   Message from Joylene Igo, [place] sent at 02/28/2017 10:00 AM EDT ----- Labs look better, keep up good work

## 2017-02-28 NOTE — Telephone Encounter (Signed)
Pt was seen 02/27/17 was given rx for klonopin, she had already increased her klonopin, she is wanting to go up to a stronger dose.  Pls advise

## 2017-02-28 NOTE — Telephone Encounter (Signed)
Mailed copy of labs 

## 2017-02-28 NOTE — Telephone Encounter (Signed)
I thought I sent in a RX for 1 mg, and it said 0.5mg  in chart? Is that not correct?

## 2017-02-28 NOTE — Telephone Encounter (Signed)
The rx was 0.5mg  take bid, she is wanting to increase to 1mg  bid

## 2017-03-01 LAB — CYTOLOGY - PAP

## 2017-04-26 ENCOUNTER — Encounter: Payer: 59 | Admitting: Obstetrics and Gynecology

## 2017-06-22 ENCOUNTER — Ambulatory Visit (INDEPENDENT_AMBULATORY_CARE_PROVIDER_SITE_OTHER): Payer: 59 | Admitting: Obstetrics and Gynecology

## 2017-06-22 ENCOUNTER — Encounter: Payer: Self-pay | Admitting: Obstetrics and Gynecology

## 2017-06-22 VITALS — BP 117/69 | HR 68 | Ht 66.0 in | Wt 133.2 lb

## 2017-06-22 DIAGNOSIS — F329 Major depressive disorder, single episode, unspecified: Secondary | ICD-10-CM

## 2017-06-22 MED ORDER — FLUOXETINE HCL 10 MG PO CAPS: 10.0000 mg | ORAL_CAPSULE | Freq: Two times a day (BID) | ORAL | 3 refills | Status: DC

## 2017-06-22 MED ORDER — CLONAZEPAM 1 MG PO TABS: 1.0000 mg | ORAL_TABLET | Freq: Two times a day (BID) | ORAL | 4 refills | Status: DC | PRN

## 2017-06-22 NOTE — Progress Notes (Signed)
Subjective:     Patient ID: Colleen Mcneil, female   DOB: 05-03-1956, 61 y.o.   MRN: [id number]  HPI Reports gradual worsening of depression. Stopped prozac 3 years ago and has done fine for a while. Klonipin helps for a while.   Increased home stressors: help caring for widowed mother. Adult addict son living with them , and other son deployed. Daughter is in a bad marriage. Feels depressed and hopeless. Desires restarting SSRI.  Depression screen PHQ 2/9 06/22/2017  Decreased Interest 3  Down, Depressed, Hopeless 3  PHQ - 2 Score 6  Altered sleeping 1  Tired, decreased energy 2  Change in appetite 3  Feeling bad or failure about yourself  0  Trouble concentrating 1  Moving slowly or fidgety/restless 0  Suicidal thoughts 0  PHQ-9 Score 13  Difficult doing work/chores Very difficult   Review of Systems Negative except stated in HPI    Objective:   Physical Exam A&Ox4 Well groomed female in no distress, tearful, and sad. Blood pressure 117/69, pulse 68, height 5\' 6"  (1.676 m), weight 133 lb 3.2 oz (60.4 kg).    Assessment:     depression    Plan:     Will restart prozac at 10mg  x 1 week and then 20mg  daily. Continue klonipin as needed. RTC as needed.  >50% of 10 minute appointment. Azarah Dacy [place], CNM

## 2017-10-25 ENCOUNTER — Other Ambulatory Visit: Payer: Self-pay | Admitting: Obstetrics and Gynecology

## 2018-01-01 ENCOUNTER — Telehealth: Payer: Self-pay | Admitting: Obstetrics and Gynecology

## 2018-01-01 NOTE — Telephone Encounter (Signed)
The patient called and stated that she would like to speak with Amy or Melody in regards to her pharmacy sending a medication refill request last Thursday. No other information was disclosed. Please advise.

## 2018-01-02 NOTE — Telephone Encounter (Signed)
Faxed to pharmacy

## 2018-03-01 ENCOUNTER — Encounter: Payer: 59 | Admitting: Obstetrics and Gynecology

## 2018-03-03 ENCOUNTER — Other Ambulatory Visit: Payer: Self-pay | Admitting: Obstetrics and Gynecology

## 2018-05-04 ENCOUNTER — Other Ambulatory Visit: Payer: Self-pay | Admitting: Obstetrics and Gynecology

## 2018-05-07 ENCOUNTER — Telehealth: Payer: Self-pay | Admitting: Obstetrics and Gynecology

## 2018-05-07 NOTE — Telephone Encounter (Signed)
The patient called requesting a refill on her medications; and seems upset that she did a request on Friday and stated the pharmacist also requested it but has not heard anything back or gotten the refill.  Good call back number is [phone] [facility] number] please advise, thanks.

## 2018-05-07 NOTE — Telephone Encounter (Signed)
Done-ac 

## 2018-05-07 NOTE — Telephone Encounter (Signed)
Patient called back again and stated she gets off work at 4:30 and would really appreciate a call at her work before then; at [phone].  If Amy unable to call her before then; please call her cell [phone] and it has voicemail.  I also told her to check with pharmacy; she stated she did before she just called.  She was also informed that Melody has been out on leave and that Amy has been working two providers today, she understands, please advise, thanks.

## 2018-06-28 ENCOUNTER — Encounter: Payer: 59 | Admitting: Obstetrics and Gynecology

## 2018-07-03 ENCOUNTER — Encounter: Payer: Self-pay | Admitting: Obstetrics and Gynecology

## 2018-07-03 ENCOUNTER — Ambulatory Visit (INDEPENDENT_AMBULATORY_CARE_PROVIDER_SITE_OTHER): Payer: 59 | Admitting: Obstetrics and Gynecology

## 2018-07-03 VITALS — BP 125/65 | HR 68 | Ht 66.0 in | Wt 142.5 lb

## 2018-07-03 DIAGNOSIS — Z01419 Encounter for gynecological examination (general) (routine) without abnormal findings: Secondary | ICD-10-CM

## 2018-07-03 DIAGNOSIS — E559 Vitamin D deficiency, unspecified: Secondary | ICD-10-CM

## 2018-07-03 DIAGNOSIS — D229 Melanocytic nevi, unspecified: Secondary | ICD-10-CM

## 2018-07-03 NOTE — Progress Notes (Signed)
Subjective:   Colleen Mcneil is a 62 y.o. G16P3 Caucasian female here for a routine well-woman exam.  No LMP recorded. Patient is postmenopausal.    Current complaints: none, feeling much better being back on prozac. PCP: [facility]       does desire labs  Social History: Sexual: heterosexual Marital Status: married Living situation: with spouse Occupation: unknown occupation Tobacco/alcohol: no tobacco use Illicit drugs: no history of illicit drug use  The following portions of the patient's history were reviewed and updated as appropriate: allergies, current medications, past family history, past medical history, past social history, past surgical history and problem list.  Past Medical History Past Medical History:  Diagnosis Date  . Anxiety   . Depression   . Elevated cholesterol   . Menopause syndrome   . Vitamin D deficiency   . Vulvar lesion     Past Surgical History Past Surgical History:  Procedure Laterality Date  . CESAREAN SECTION  1986  . COLONOSCOPY WITH PROPOFOL N/A 05/20/2016   Procedure: COLONOSCOPY WITH PROPOFOL;  Surgeon: Lucilla Lame, MD;  Location: [place];  Service: Endoscopy;  Laterality: N/A;  . HERNIA REPAIR    . POLYPECTOMY  05/20/2016   Procedure: POLYPECTOMY;  Surgeon: Lucilla Lame, MD;  Location: [place];  Service: Endoscopy;;  . TUBAL LIGATION      Gynecologic History G3P3  No LMP recorded. Patient is postmenopausal. Contraception: post menopausal status Last Pap: 2018. Results were: normal Last mammogram: 02/2017. Results were: normal   Obstetric History OB History  Gravida Para Term Preterm AB Living  3 3          SAB TAB Ectopic Multiple Live Births               # Outcome Date GA Lbr Len/2nd Weight Sex Delivery Anes PTL Lv  3 Para 37    M CS-Unspec     2 Para 1982    F Vag-Spont     1 Para 1981    M Vag-Spont       Current Medications Current Outpatient Medications on File Prior to  Visit  Medication Sig Dispense Refill  . Cholecalciferol (VITAMIN D3) 5000 units CAPS Take 1 capsule (5,000 Units total) by mouth daily. 120 capsule 2  . clonazePAM (KLONOPIN) 1 MG tablet TAKE 1 TABLET TWICE DAILY AS NEEDED FOR ANXIETY 60 tablet 2  . FLUoxetine (PROZAC) 10 MG capsule TAKE (2) CAPSULES BY MOUTH ONCE DAILY. 60 capsule 4  . podofilox (CONDYLOX) 0.5 % gel Apply topically Nightly. (Patient not taking: Reported on 07/03/2018) 3.5 g 2   No current facility-administered medications on file prior to visit.     Review of Systems Patient denies any headaches, blurred vision, shortness of breath, chest pain, abdominal pain, problems with bowel movements, urination, or intercourse.  Objective:  BP 125/65   Pulse 68   Ht 5\' 6"  (1.676 m)   Wt 142 lb 8 oz (64.6 kg)   BMI 23.00 kg/m  Physical Exam  General:  Well developed, well nourished, no acute distress. She is alert and oriented x3. Skin:  Warm and dry, with flat black 45mm x 87mm nevi with irregular boarders. Neck:  Midline trachea, no thyromegaly or nodules Cardiovascular: Regular rate and rhythm, no murmur heard Lungs:  Effort normal, all lung fields clear to auscultation bilaterally Breasts:  No dominant palpable mass, retraction, or nipple discharge Abdomen:  Soft, non tender, no hepatosplenomegaly or masses Pelvic:  External genitalia  is normal in appearance with exception on previously noted raised nevi on left peri-anal region..  The vagina is normal in appearance. The cervix is bulbous, no CMT.  Thin prep pap is not done. Uterus is felt to be normal size, shape, and contour.  No adnexal masses or tenderness noted. Extremities:  No swelling or varicosities noted Psych:  She has a normal mood and affect  Assessment:   Healthy well-woman exam Vit D deficiency Atypical nevi on right mid-back   Plan:  Labs and BDS ordered-will follow up accordingly. Referred to dermatology.  F/U 1 year for AE, or sooner if  needed Mammogram ordered   Tenaya Hilyer Rockney Ghee, CNM

## 2018-07-04 ENCOUNTER — Telehealth: Payer: Self-pay | Admitting: *Deleted

## 2018-07-04 ENCOUNTER — Other Ambulatory Visit: Payer: Self-pay | Admitting: Obstetrics and Gynecology

## 2018-07-04 DIAGNOSIS — R7303 Prediabetes: Secondary | ICD-10-CM

## 2018-07-04 LAB — COMPREHENSIVE METABOLIC PANEL
ALT: 13 IU/L (ref 0–32)
AST: 18 IU/L (ref 0–40)
Albumin/Globulin Ratio: 2.1 (ref 1.2–2.2)
Albumin: 4.4 g/dL (ref 3.6–4.8)
Alkaline Phosphatase: 58 IU/L (ref 39–117)
BUN/Creatinine Ratio: 23 (ref 12–28)
BUN: 15 mg/dL (ref 8–27)
Bilirubin Total: 0.2 mg/dL (ref 0.0–1.2)
CALCIUM: 9.7 mg/dL (ref 8.7–10.3)
CO2: 25 mmol/L (ref 20–29)
Chloride: 104 mmol/L (ref 96–106)
Creatinine, Ser: 0.64 mg/dL (ref 0.57–1.00)
GFR calc Af Amer: 111 mL/min/{1.73_m2} (ref >59)
GFR calc non Af Amer: 96 mL/min/{1.73_m2} (ref >59)
GLOBULIN, TOTAL: 2.1 g/dL (ref 1.5–4.5)
Glucose: 101 mg/dL — ABNORMAL HIGH (ref 65–99)
Potassium: 4.2 mmol/L (ref 3.5–5.2)
SODIUM: 142 mmol/L (ref 134–144)
Total Protein: 6.5 g/dL (ref 6.0–8.5)

## 2018-07-04 LAB — LIPID PANEL
CHOLESTEROL TOTAL: 206 mg/dL — AB (ref 100–199)
Chol/HDL Ratio: 4.6 ratio — ABNORMAL HIGH (ref 0.0–4.4)
HDL: 45 mg/dL (ref >39)
LDL CALC: 133 mg/dL — AB (ref 0–99)
Triglycerides: 142 mg/dL (ref 0–149)
VLDL Cholesterol Cal: 28 mg/dL (ref 5–40)

## 2018-07-04 LAB — TSH: TSH: 2.07 u[IU]/mL (ref 0.450–4.500)

## 2018-07-04 LAB — VITAMIN D 25 HYDROXY (VIT D DEFICIENCY, FRACTURES): Vit D, 25-Hydroxy: 28.1 ng/mL — ABNORMAL LOW (ref 30.0–100.0)

## 2018-07-04 LAB — HEMOGLOBIN A1C
ESTIMATED AVERAGE GLUCOSE: 128 mg/dL
HEMOGLOBIN A1C: 6.1 % — AB (ref 4.8–5.6)

## 2018-07-04 NOTE — Telephone Encounter (Signed)
Mailed all info to pt 

## 2018-07-04 NOTE — Telephone Encounter (Signed)
-----   Message from Joylene Igo, [place] sent at 07/04/2018  8:37 AM EDT ----- Please let her know labs  Vit D is still low ( I will send in refill on vit D supplement) also cholesterol is about the same. But HgA1c is elevated showing prediabetes. Please mail info on it and low carb diet. I want to recheck it and vit D in 6 months.

## 2018-07-10 ENCOUNTER — Other Ambulatory Visit: Payer: Self-pay | Admitting: *Deleted

## 2018-07-10 MED ORDER — CLONAZEPAM 1 MG PO TABS: ORAL_TABLET | ORAL | 2 refills | Status: DC

## 2018-07-23 ENCOUNTER — Other Ambulatory Visit: Payer: Self-pay | Admitting: Obstetrics and Gynecology

## 2018-07-23 DIAGNOSIS — Z1231 Encounter for screening mammogram for malignant neoplasm of breast: Secondary | ICD-10-CM

## 2018-08-14 ENCOUNTER — Ambulatory Visit
Admission: RE | Admit: 2018-08-14 | Discharge: 2018-08-14 | Disposition: A | Discharge status: Home / Self Care | Payer: 59 | Source: Ambulatory Visit | Attending: Obstetrics and Gynecology | Admitting: Obstetrics and Gynecology

## 2018-08-14 DIAGNOSIS — Z1231 Encounter for screening mammogram for malignant neoplasm of breast: Secondary | ICD-10-CM

## 2018-08-14 DIAGNOSIS — Z01419 Encounter for gynecological examination (general) (routine) without abnormal findings: Secondary | ICD-10-CM | POA: Diagnosis not present

## 2018-08-15 ENCOUNTER — Other Ambulatory Visit: Payer: Self-pay | Admitting: Obstetrics and Gynecology

## 2018-08-15 DIAGNOSIS — R928 Other abnormal and inconclusive findings on diagnostic imaging of breast: Secondary | ICD-10-CM

## 2018-08-15 DIAGNOSIS — N6489 Other specified disorders of breast: Secondary | ICD-10-CM

## 2018-08-22 ENCOUNTER — Telehealth: Payer: Self-pay | Admitting: *Deleted

## 2018-08-22 NOTE — Telephone Encounter (Signed)
Notified pt of results 

## 2018-08-22 NOTE — Telephone Encounter (Signed)
-----   Message from Joylene Igo, [place] sent at 08/22/2018  2:51 PM EDT ----- Please let her know results, and instruct her to please continue Vit d to help prevent worsening bone loss in addition to treating to deficiency

## 2018-08-27 ENCOUNTER — Ambulatory Visit
Admission: RE | Admit: 2018-08-27 | Discharge: 2018-08-27 | Disposition: A | Discharge status: Home / Self Care | Payer: 59 | Source: Ambulatory Visit | Attending: Obstetrics and Gynecology | Admitting: Obstetrics and Gynecology

## 2018-08-27 DIAGNOSIS — N6489 Other specified disorders of breast: Secondary | ICD-10-CM | POA: Diagnosis present

## 2018-08-27 DIAGNOSIS — R928 Other abnormal and inconclusive findings on diagnostic imaging of breast: Secondary | ICD-10-CM | POA: Insufficient documentation

## 2018-09-20 ENCOUNTER — Telehealth: Payer: Self-pay | Admitting: Obstetrics and Gynecology

## 2018-09-20 NOTE — Telephone Encounter (Signed)
The patient called and stated that she would like to speak Colleen Mcneil's nurse before 4:30 pm today. No other information was disclosed in the voicemail. Please advise.

## 2018-10-08 ENCOUNTER — Other Ambulatory Visit: Payer: Self-pay | Admitting: *Deleted

## 2018-10-08 NOTE — Telephone Encounter (Signed)
Done-ac 

## 2019-01-10 ENCOUNTER — Other Ambulatory Visit: Payer: Self-pay | Admitting: *Deleted

## 2019-01-10 MED ORDER — FLUOXETINE HCL 10 MG PO CAPS: ORAL_CAPSULE | ORAL | 4 refills | Status: DC

## 2019-01-23 ENCOUNTER — Other Ambulatory Visit: Payer: Self-pay | Admitting: *Deleted

## 2019-01-23 MED ORDER — CLONAZEPAM 1 MG PO TABS: ORAL_TABLET | ORAL | 2 refills | Status: DC

## 2019-05-20 ENCOUNTER — Other Ambulatory Visit: Payer: Self-pay

## 2019-05-20 MED ORDER — CLONAZEPAM 1 MG PO TABS: ORAL_TABLET | ORAL | 2 refills | Status: DC

## 2019-07-10 ENCOUNTER — Encounter: Payer: 59 | Admitting: Obstetrics and Gynecology

## 2019-08-09 ENCOUNTER — Other Ambulatory Visit: Payer: Self-pay

## 2019-08-09 ENCOUNTER — Ambulatory Visit (INDEPENDENT_AMBULATORY_CARE_PROVIDER_SITE_OTHER): Payer: 59 | Admitting: Obstetrics and Gynecology

## 2019-08-09 ENCOUNTER — Encounter: Payer: Self-pay | Admitting: Obstetrics and Gynecology

## 2019-08-09 VITALS — BP 134/84 | HR 88 | Ht 66.0 in | Wt 142.5 lb

## 2019-08-09 DIAGNOSIS — E559 Vitamin D deficiency, unspecified: Secondary | ICD-10-CM | POA: Diagnosis not present

## 2019-08-09 DIAGNOSIS — Z01419 Encounter for gynecological examination (general) (routine) without abnormal findings: Secondary | ICD-10-CM | POA: Diagnosis not present

## 2019-08-09 DIAGNOSIS — A609 Anogenital herpesviral infection, unspecified: Secondary | ICD-10-CM

## 2019-08-09 DIAGNOSIS — D229 Melanocytic nevi, unspecified: Secondary | ICD-10-CM

## 2019-08-09 DIAGNOSIS — R7303 Prediabetes: Secondary | ICD-10-CM

## 2019-08-09 MED ORDER — ACYCLOVIR 400 MG PO TABS: 400.0000 mg | ORAL_TABLET | Freq: Three times a day (TID) | ORAL | 2 refills | Status: DC

## 2019-08-09 MED ORDER — CLOTRIMAZOLE-BETAMETHASONE 1-0.05 % EX CREA: 1.0000 "application " | TOPICAL_CREAM | Freq: Two times a day (BID) | CUTANEOUS | 0 refills | Status: DC

## 2019-08-09 MED ORDER — FLUOXETINE HCL 10 MG PO CAPS: ORAL_CAPSULE | ORAL | 4 refills | Status: DC

## 2019-08-09 MED ORDER — CLONAZEPAM 1 MG PO TABS: ORAL_TABLET | ORAL | 2 refills | Status: DC

## 2019-08-09 NOTE — Progress Notes (Signed)
Subjective:   Colleen Mcneil is a 63 y.o. G80P3 Caucasian female here for a routine well-woman exam.  No LMP recorded. Patient is postmenopausal.    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 PCP: [facility]       does desire labs  Social History: Sexual: heterosexual Marital Status: married Living situation: with spouse Occupation: Engineer, building services FT  Tobacco/alcohol: no tobacco use Illicit drugs: no history of illicit drug use  The following portions of the patient's history were reviewed and updated as appropriate: allergies, current medications, past family history, past medical history, past social history, past surgical history and problem list.  Past Medical History Past Medical History:  Diagnosis Date  . Anxiety   . Depression   . Elevated cholesterol   . Menopause syndrome   . Vitamin D deficiency   . Vulvar lesion     Past Surgical History Past Surgical History:  Procedure Laterality Date  . CESAREAN SECTION  1986  . COLONOSCOPY WITH PROPOFOL N/A 05/20/2016   Procedure: COLONOSCOPY WITH PROPOFOL;  Surgeon: Lucilla Lame, MD;  Location: [place];  Service: Endoscopy;  Laterality: N/A;  . HERNIA REPAIR    . POLYPECTOMY  05/20/2016   Procedure: POLYPECTOMY;  Surgeon: Lucilla Lame, MD;  Location: [place];  Service: Endoscopy;;  . TUBAL LIGATION      Gynecologic History G3P3  No LMP recorded. Patient is postmenopausal. Contraception: tubal ligation Last Pap: 2018. Results were: normal Last mammogram: 08/2018. Results were: abnormal w/ f/u normal  Obstetric History OB History  Gravida Para Term Preterm AB Living  3 3          SAB TAB Ectopic Multiple Live Births               # Outcome Date GA Lbr Len/2nd Weight Sex Delivery Anes PTL Lv  3 Para 80    M CS-Unspec     2 Para 1982    F Vag-Spont     1 Para 1981    M Vag-Spont       Current Medications Current Outpatient Medications on File  Prior to Visit  Medication Sig Dispense Refill  . Cholecalciferol (VITAMIN D3) 5000 units CAPS Take 1 capsule (5,000 Units total) by mouth daily. 120 capsule 2  . clonazePAM (KLONOPIN) 1 MG tablet TAKE 1 TABLET TWICE DAILY AS NEEDED FOR ANXIETY 60 tablet 2  . FLUoxetine (PROZAC) 10 MG capsule TAKE (2) CAPSULES BY MOUTH ONCE DAILY. 60 capsule 4  . podofilox (CONDYLOX) 0.5 % gel Apply topically Nightly. (Patient not taking: Reported on 07/03/2018) 3.5 g 2   No current facility-administered medications on file prior to visit.     Review of Systems Patient denies any headaches, blurred vision, shortness of breath, chest pain, abdominal pain, problems with bowel movements, urination, or intercourse.  Objective:  BP 134/84   Pulse 88   Ht 5\' 6"  (1.676 m)   Wt 142 lb 8 oz (64.6 kg)   BMI 23.00 kg/m  Physical Exam  General:  Well developed, well nourished, no acute distress. She is alert and oriented x3. Skin:  Warm and dry Neck:  Midline trachea, no thyromegaly or nodules Cardiovascular: Regular rate and rhythm, no murmur heard Lungs:  Effort normal, all lung fields clear to auscultation bilaterally Breasts:  No dominant palpable mass, retraction, or nipple discharge Abdomen:  Soft, non tender, no hepatosplenomegaly or masses Pelvic:  External genitalia is inflammed from introitus to rectum  with deeper pigmentation noted on left vulvar keratosis lesion in appearance.  The vagina is normal and atrophic in appearance. The cervix is bulbous, no CMT.  Thin prep pap is not done. Uterus is felt to be normal size, shape, and contour.  No adnexal masses or tenderness noted. Extremities:  No swelling or varicosities noted Psych:  She has a normal mood and affect, but tearful when discussing sons death.  Assessment:   Healthy well-woman exam Vit D def Pre-diabetes Vulvar lesion vulvitis  Plan:  Labs obtained-will follow up accordingly RTC 1 week for vulvar biopsy- lotrisone and acyclovir  prescribed for use until then.  F/U 1 year for AE, or sooner if needed Mammogram ordered  Melody Rockney Ghee, CNM

## 2019-08-10 LAB — CBC
Hematocrit: 37.7 % (ref 34.0–46.6)
Hemoglobin: 12.5 g/dL (ref 11.1–15.9)
MCH: 30.3 pg (ref 26.6–33.0)
MCHC: 33.2 g/dL (ref 31.5–35.7)
MCV: 91 fL (ref 79–97)
Platelets: 274 10*3/uL (ref 150–450)
RBC: 4.13 x10E6/uL (ref 3.77–5.28)
RDW: 12.1 % (ref 11.7–15.4)
WBC: 7.6 10*3/uL (ref 3.4–10.8)

## 2019-08-10 LAB — COMPREHENSIVE METABOLIC PANEL
ALT: 9 IU/L (ref 0–32)
AST: 17 IU/L (ref 0–40)
Albumin/Globulin Ratio: 2.1 (ref 1.2–2.2)
Albumin: 4.1 g/dL (ref 3.8–4.8)
Alkaline Phosphatase: 67 IU/L (ref 39–117)
BUN/Creatinine Ratio: 25 (ref 12–28)
BUN: 18 mg/dL (ref 8–27)
Bilirubin Total: <0.2 mg/dL (ref 0.0–1.2)
CO2: 27 mmol/L (ref 20–29)
Calcium: 9.6 mg/dL (ref 8.7–10.3)
Chloride: 103 mmol/L (ref 96–106)
Creatinine, Ser: 0.73 mg/dL (ref 0.57–1.00)
GFR calc Af Amer: 101 mL/min/{1.73_m2} (ref >59)
GFR calc non Af Amer: 88 mL/min/{1.73_m2} (ref >59)
Globulin, Total: 2 g/dL (ref 1.5–4.5)
Glucose: 106 mg/dL — ABNORMAL HIGH (ref 65–99)
Potassium: 4.2 mmol/L (ref 3.5–5.2)
Sodium: 143 mmol/L (ref 134–144)
Total Protein: 6.1 g/dL (ref 6.0–8.5)

## 2019-08-10 LAB — LIPID PANEL
Chol/HDL Ratio: 4.8 ratio — ABNORMAL HIGH (ref 0.0–4.4)
Cholesterol, Total: 219 mg/dL — ABNORMAL HIGH (ref 100–199)
HDL: 46 mg/dL (ref >39)
LDL Chol Calc (NIH): 150 mg/dL — ABNORMAL HIGH (ref 0–99)
Triglycerides: 130 mg/dL (ref 0–149)
VLDL Cholesterol Cal: 23 mg/dL (ref 5–40)

## 2019-08-10 LAB — VITAMIN D 25 HYDROXY (VIT D DEFICIENCY, FRACTURES): Vit D, 25-Hydroxy: 64.5 ng/mL (ref 30.0–100.0)

## 2019-08-10 LAB — TSH: TSH: 2.36 u[IU]/mL (ref 0.450–4.500)

## 2019-08-10 LAB — HEMOGLOBIN A1C
Est. average glucose Bld gHb Est-mCnc: 128 mg/dL
Hgb A1c MFr Bld: 6.1 % — ABNORMAL HIGH (ref 4.8–5.6)

## 2019-08-15 ENCOUNTER — Other Ambulatory Visit (HOSPITAL_COMMUNITY)
Admission: RE | Admit: 2019-08-15 | Discharge: 2019-08-15 | Disposition: A | Discharge status: Home / Self Care | Payer: 59 | Source: Ambulatory Visit | Attending: Obstetrics and Gynecology | Admitting: Obstetrics and Gynecology

## 2019-08-15 ENCOUNTER — Other Ambulatory Visit: Payer: Self-pay

## 2019-08-15 ENCOUNTER — Encounter: Payer: Self-pay | Admitting: Obstetrics and Gynecology

## 2019-08-15 ENCOUNTER — Ambulatory Visit (INDEPENDENT_AMBULATORY_CARE_PROVIDER_SITE_OTHER): Payer: 59 | Admitting: Obstetrics and Gynecology

## 2019-08-15 VITALS — BP 116/66 | HR 66 | Ht 66.0 in | Wt 141.6 lb

## 2019-08-15 DIAGNOSIS — D229 Melanocytic nevi, unspecified: Secondary | ICD-10-CM | POA: Insufficient documentation

## 2019-08-15 NOTE — Progress Notes (Signed)
Patient ID: Colleen Mcneil, female   DOB: 12-May-1956, 63 y.o.   MRN: [id number]  No chief complaint on file.   HPI Colleen Mcneil is a 63 y.o. female.  Here for biopsy of left perineal/gluteal lesion that has gotten larger over last 2 years. HPI Indication: patchy, crepitant and with areas of dark discolorations  of the left perineum extending down to rectum of gluteal tissue; measures 5cmx 4.5cmx3.4cm, irregular borders Symptoms:   pruritic and not painful  Location:  anus  Past Medical History:  Diagnosis Date  . Anxiety   . Depression   . Elevated cholesterol   . Menopause syndrome   . Vitamin D deficiency   . Vulvar lesion     Past Surgical History:  Procedure Laterality Date  . CESAREAN SECTION  1986  . COLONOSCOPY WITH PROPOFOL N/A 05/20/2016   Procedure: COLONOSCOPY WITH PROPOFOL;  Surgeon: Lucilla Lame, MD;  Location: [place];  Service: Endoscopy;  Laterality: N/A;  . HERNIA REPAIR    . POLYPECTOMY  05/20/2016   Procedure: POLYPECTOMY;  Surgeon: Lucilla Lame, MD;  Location: [place];  Service: Endoscopy;;  . TUBAL LIGATION      Family History  Problem Relation Age of Onset  . Heart disease Mother   . Diabetes Mother   . Diabetes Father   . Liver disease Son   . Breast cancer Neg Hx     Social History Social History   Tobacco Use  . Smoking status: Current Every Day Smoker    Packs/day: 0.50    Years: 25.00    Pack years: 12.50    Types: Cigarettes  . Smokeless tobacco: Never Used  Substance Use Topics  . Alcohol use: No  . Drug use: No    Allergies  Allergen Reactions  . Macrobid [Nitrofurantoin Monohyd Macro] Nausea And Vomiting    Current Outpatient Medications  Medication Sig Dispense Refill  . acyclovir (ZOVIRAX) 400 MG tablet Take 1 tablet (400 mg total) by mouth 3 (three) times daily. 90 tablet 2  . Cholecalciferol (VITAMIN D3) 5000 units CAPS Take 1 capsule (5,000 Units total) by mouth daily. 120 capsule 2  .  clonazePAM (KLONOPIN) 1 MG tablet TAKE 1 TABLET TWICE DAILY AS NEEDED FOR ANXIETY 60 tablet 2  . clotrimazole-betamethasone (LOTRISONE) cream Apply 1 application topically 2 (two) times daily. 30 g 0  . FLUoxetine (PROZAC) 10 MG capsule TAKE (2) CAPSULES BY MOUTH ONCE DAILY. 60 capsule 4  . podofilox (CONDYLOX) 0.5 % gel Apply topically Nightly. 3.5 g 2   No current facility-administered medications for this visit.     Review of Systems Review of Systems  Blood pressure 116/66, pulse 66, height 5\' 6"  (1.676 m), weight 141 lb 9.6 oz (64.2 kg).  Physical Exam Physical Exam  Data Reviewed Previous biopsy 4 years ago  Assessment    Prepping with Betadine Local anesthesia with 1% Buffered Lidocaine 4  mm punch biopsies x 2 one at top of lesion, second at bottom of lesion performed per protocol Silver Nitrate applied:  Yes Well tolerated  Specimen appropriately identified and sent to pathology    Plan    Follow-up:  4 weeks, will call with pathology results.      Zahki Hoogendoorn N Kalana Yust 08/15/2019, 5:26 PM

## 2019-08-15 NOTE — Progress Notes (Signed)
Patient comes in today for vulvar biopsy.

## 2019-08-19 LAB — SURGICAL PATHOLOGY

## 2019-08-20 ENCOUNTER — Other Ambulatory Visit: Payer: Self-pay | Admitting: Obstetrics and Gynecology

## 2019-08-20 ENCOUNTER — Telehealth: Payer: Self-pay | Admitting: Obstetrics and Gynecology

## 2019-08-20 DIAGNOSIS — K6282 Dysplasia of anus: Secondary | ICD-10-CM

## 2019-08-20 DIAGNOSIS — D229 Melanocytic nevi, unspecified: Secondary | ICD-10-CM

## 2019-08-20 NOTE — Telephone Encounter (Signed)
Called patient and discussed results, recommend gyn/onc referral for removal of nevi- order placed.

## 2019-08-20 NOTE — Telephone Encounter (Signed)
Please advise on results

## 2019-08-20 NOTE — Telephone Encounter (Signed)
The patient called and stated that she is wanting to know the status of her results from last Thursday. Pt is requesting a call back today if possible. Please advise.

## 2019-08-22 ENCOUNTER — Telehealth: Payer: Self-pay

## 2019-08-22 NOTE — Telephone Encounter (Signed)
Referral received. Chart reviewed. Offered appointment for 10/21. She prefers female physician. Next available 10/28 can be offered.

## 2019-08-27 ENCOUNTER — Other Ambulatory Visit: Payer: Self-pay

## 2019-08-27 NOTE — Progress Notes (Signed)
Patient pre screened for office appointment, no questions or concerns today. 

## 2019-08-28 ENCOUNTER — Inpatient Hospital Stay: Payer: 59 | Attending: Obstetrics and Gynecology | Admitting: Obstetrics and Gynecology

## 2019-08-28 ENCOUNTER — Encounter: Payer: Self-pay | Admitting: Obstetrics and Gynecology

## 2019-08-28 ENCOUNTER — Other Ambulatory Visit: Payer: Self-pay

## 2019-08-28 DIAGNOSIS — F17210 Nicotine dependence, cigarettes, uncomplicated: Secondary | ICD-10-CM | POA: Insufficient documentation

## 2019-08-28 DIAGNOSIS — F329 Major depressive disorder, single episode, unspecified: Secondary | ICD-10-CM | POA: Diagnosis not present

## 2019-08-28 DIAGNOSIS — Z780 Asymptomatic menopausal state: Secondary | ICD-10-CM | POA: Diagnosis not present

## 2019-08-28 DIAGNOSIS — Z79899 Other long term (current) drug therapy: Secondary | ICD-10-CM | POA: Diagnosis not present

## 2019-08-28 DIAGNOSIS — E559 Vitamin D deficiency, unspecified: Secondary | ICD-10-CM | POA: Insufficient documentation

## 2019-08-28 DIAGNOSIS — K6282 Dysplasia of anus: Secondary | ICD-10-CM | POA: Diagnosis not present

## 2019-08-28 DIAGNOSIS — N9089 Other specified noninflammatory disorders of vulva and perineum: Secondary | ICD-10-CM | POA: Insufficient documentation

## 2019-08-28 DIAGNOSIS — D125 Benign neoplasm of sigmoid colon: Secondary | ICD-10-CM | POA: Diagnosis not present

## 2019-08-28 DIAGNOSIS — E7800 Pure hypercholesterolemia, unspecified: Secondary | ICD-10-CM | POA: Insufficient documentation

## 2019-08-28 DIAGNOSIS — F419 Anxiety disorder, unspecified: Secondary | ICD-10-CM | POA: Diagnosis not present

## 2019-08-28 NOTE — Progress Notes (Signed)
Pt states that she was dx genital warts 1976 and was treated with dyphylin. Since then she has a kid and dr long was her gyn and he always said the spot was keratosis. But several times it gets uncomfortable with urination and sometimes clothing on it. And she has not had sex in over 5 years so she does not know if it would bother that activity.

## 2019-08-28 NOTE — Progress Notes (Signed)
Gynecologic Oncology Consult Visit   Referring Provider: Lorelle Gibbs, CNM  Chief Complaint: Vulvar lesion with dysplasia.  Subjective:  Colleen Mcneil is a 63 y.o. female who is seen in consultation from [facility], [place]  Patient initially presented for left perineal/gluteal lesion that has been enlarging over 2 years.  Measured 5 cm x 4-1/2 cm x 3.4 cm with irregular borders, pleuritic but not painful.  Biopsy was performed on 08/15/2019.  History of genital warts at age 12 treated with podophyllin.  She has had persistent lesion there for many years. Not painful, but not sexually active for a few years. Husband has severe arthritis.  Son died earlier this year due to drug addiction.  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   A. SKIN, LEFT PERINEUM BOTTOM, BIOPSY:  - Moderate squamous dysplasia, focal.   B. SKIN, LEFT PERINEUM TOP, BIOPSY:  - Moderate squamous dysplasia, focal.  Last hgA1c was 6.1. TSH 2.360. Last Pap was 02/27/2017 - NILM, HR HPV Negative. She is a current smoker 1/2 PPD.   Problem List: Patient Active Problem List   Diagnosis Date Noted  . 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 Pre-diabetes 07/04/2018  . Special screening for malignant neoplasms, colon   . Benign neoplasm of sigmoid colon     Past Medical History: Past Medical History:  Diagnosis Date  . Anxiety   . Depression   . Elevated cholesterol   . Menopause syndrome   . Vitamin D deficiency   . Vulvar lesion     Past Surgical History: Past Surgical History:  Procedure Laterality Date  . CESAREAN SECTION  1986  . COLONOSCOPY WITH PROPOFOL N/A 05/20/2016   Procedure: COLONOSCOPY WITH PROPOFOL;  Surgeon: Lucilla Lame, MD;  Location: [place];  Service: Endoscopy;  Laterality: N/A;  . HERNIA REPAIR    . POLYPECTOMY  05/20/2016   Procedure: POLYPECTOMY;  Surgeon: Lucilla Lame, MD;  Location: [facility] [place];  Service: Endoscopy;;  . TUBAL LIGATION      OB History:  OB  History  Gravida Para Term Preterm AB Living  3 3          SAB TAB Ectopic Multiple Live Births               # Outcome Date GA Lbr Len/2nd Weight Sex Delivery Anes PTL Lv  3 Para 42    M CS-Unspec     2 Para 1982    F Vag-Spont     1 Para 1981    M Vag-Spont       Family History: Family History  Problem Relation Age of Onset  . Heart disease Mother   . Diabetes Mother   . Diabetes Father   . Liver disease Son   . Breast cancer Neg Hx     Social History: Social History   Socioeconomic History  . Marital status: Married    Spouse name: Not on file  . Number of children: Not on file  . Years of education: Not on file  . Highest education level: Not on file  Occupational History  . Not on file  Social Needs  . Financial resource strain: Not on file  . Food insecurity    Worry: Not on file    Inability: Not on file  . Transportation needs    Medical: Not on file    Non-medical: Not on file  Tobacco Use  . Smoking status: Current Every Day Smoker    Packs/day:  0.50    Years: 25.00    Pack years: 12.50    Types: Cigarettes  . Smokeless tobacco: Never Used  Substance and Sexual Activity  . Alcohol use: No  . Drug use: No  . Sexual activity: Not Currently  Lifestyle  . Physical activity    Days per week: Not on file    Minutes per session: Not on file  . Stress: Not on file  Relationships  . Social Herbalist on phone: Not on file    Gets together: Not on file    Attends religious service: Not on file    Active member of club or organization: Not on file    Attends meetings of clubs or organizations: Not on file    Relationship status: Not on file  . Intimate partner violence    Fear of current or ex partner: Not on file    Emotionally abused: Not on file    Physically abused: Not on file    Forced sexual activity: Not on file  Other Topics Concern  . Not on file  Social History Narrative  . Not on file    Allergies: Allergies   Allergen Reactions  . Macrobid [Nitrofurantoin Monohyd Macro] Nausea And Vomiting    Current Medications: Current Outpatient Medications  Medication Sig Dispense Refill  . Cholecalciferol (VITAMIN D3) 5000 units CAPS Take 1 capsule (5,000 Units total) by mouth daily. 120 capsule 2  . clonazePAM (KLONOPIN) 1 MG tablet TAKE 1 TABLET TWICE DAILY AS NEEDED FOR ANXIETY 60 tablet 2  . FLUoxetine (PROZAC) 10 MG capsule TAKE (2) CAPSULES BY MOUTH ONCE DAILY. 60 capsule 4  . acyclovir (ZOVIRAX) 400 MG tablet Take 1 tablet (400 mg total) by mouth 3 (three) times daily. (Patient not taking: Reported on 08/27/2019) 90 tablet 2   No current facility-administered medications for this visit.     Review of Systems General: negative for fevers, chills, fatigue, changes in sleep, changes in weight or appetite Skin: negative for changes in color, texture, moles or lesions Eyes: negative for changes in vision, pain, diplopia HEENT: negative for change in hearing, pain, discharge, tinnitus, vertigo, voice changes, sore throat, neck masses Breasts: negative for breast lumps Pulmonary: negative for dyspnea, orthopnea, productive cough Cardiac: negative for palpitations, syncope, pain, discomfort, pressure Gastrointestinal: negative for dysphagia, nausea, vomiting, jaundice, pain, constipation, diarrhea, hematemesis, hematoche  Musculoskeletal: negative for pain, stiffness, swelling, range of motion limitation Hematology: negative for easy bruising, bleeding Neurologic/Psych: negative for headaches, seizures, paralysis, weakness, tremor, change in gait, change in sensation, mood swings, depression, anxiety, change in memory  Objective:  Physical Examination:  There were no vitals taken for this visit.    ECOG Performance Status: 0 - Asymptomatic  GENERAL: Patient is a well appearing female in no acute distress HEENT:  PERRL, neck supple with midline trachea. Thyroid without masses.  NODES:  No  cervical, supraclavicular, axillary, or inguinal lymphadenopathy palpated.  LUNGS:  Clear to auscultation bilaterally.  No wheezes or rhonchi. HEART:  Regular rate and rhythm. No murmur appreciated. ABDOMEN:  Soft, nontender.  Positive, normoactive bowel sounds.  MSK:  No focal spinal tenderness to palpation. Full range of motion bilaterally in the upper extremities. EXTREMITIES:  No peripheral edema.   SKIN:  Clear with no obvious rashes or skin changes. No nail dyscrasia. NEURO:  Nonfocal. Well oriented.  Appropriate affect.  Pelvic: EGBUS: 5x6 cm raised warty lesion involving left perineum and perianal area at 12-3 o'clock.  Cervix:  no lesions, nontender, mobile Vagina: no lesions, no discharge or bleeding Uterus: normal size, nontender, mobile Adnexa: no palpable masses Rectovaginal: confirmatory  Assessment:  Colleen Mcneil is a 63 y.o. female diagnosed with vulvar lesion with biopsy showing dysplasia, but it is a broad raised area that is somewhat concerning for early invasive cancer.  Remote history of genital warts treated with podophyllin.      Medical co-morbidities complicating care: smoker.  Plan:   Problem List Items Addressed This Visit      Digestive   Moderate dysplasia of anus - Primary     We discussed options for management including wide local excision to remove the lesion and rule out invasive cancer. We discussed that it is close to the anus and that a large area of tissue needs to be removed. Because she is fairly small and thin it may be difficult to close the defect without tension.  In view of this, rhomboid advancement flap may be needed.  Told her that it would be best to do this surgery at [facility] where plastic surgery available to help with closure.  Am concerned that if there is wound breakdown due to primary closure with tension, then it may be necessary to do temporary colostomy in view of close proximity of the resection to the anus.    We will see her  in clinic at [facility] and plan for surgery in the next few weeks.  Advised smoking cessation.    The patient's diagnosis, an outline of the further diagnostic and laboratory studies which will be required, the recommendation for surgery, and alternatives were discussed with her and her accompanying family members.  All questions were answered to their satisfaction.  A total of 45 minutes were spent with the patient/family today; 60 % was spent in education, counseling and coordination of care for vulvar lesion.    Mellody Drown, MD  CC:  Joylene Igo, CNM [place],  [place] [id number] [phone]

## 2019-09-06 DIAGNOSIS — Z634 Disappearance and death of family member: Secondary | ICD-10-CM | POA: Insufficient documentation

## 2019-09-06 DIAGNOSIS — D125 Benign neoplasm of sigmoid colon: Secondary | ICD-10-CM | POA: Insufficient documentation

## 2019-09-06 DIAGNOSIS — F419 Anxiety disorder, unspecified: Secondary | ICD-10-CM | POA: Insufficient documentation

## 2019-10-14 ENCOUNTER — Telehealth: Payer: Self-pay

## 2019-10-14 NOTE — Telephone Encounter (Signed)
Call placed to Colleen Mcneil. She is recovering well from surgery. She will be seeing plastics next week to have sutures removed. 6 week follow up arranged with Dr. Fransisca Connors for 11/20/19 at 1030. Read back performed.

## 2019-11-19 ENCOUNTER — Other Ambulatory Visit: Payer: Self-pay

## 2019-11-19 NOTE — Progress Notes (Signed)
Patient pre screened for office appointment, no questions or concerns today. Patient reminded of upcoming appointment time and date. 

## 2019-11-20 ENCOUNTER — Telehealth: Payer: Self-pay

## 2019-11-20 ENCOUNTER — Other Ambulatory Visit: Payer: Self-pay | Admitting: Obstetrics and Gynecology

## 2019-11-20 ENCOUNTER — Other Ambulatory Visit: Payer: Self-pay

## 2019-11-20 ENCOUNTER — Inpatient Hospital Stay: Payer: 59 | Attending: Obstetrics and Gynecology | Admitting: Obstetrics and Gynecology

## 2019-11-20 ENCOUNTER — Ambulatory Visit
Admission: RE | Admit: 2019-11-20 | Discharge: 2019-11-20 | Disposition: A | Discharge status: Home / Self Care | Payer: 59 | Source: Ambulatory Visit | Attending: Obstetrics and Gynecology | Admitting: Obstetrics and Gynecology

## 2019-11-20 VITALS — BP 120/73 | HR 56 | Temp 98.4°F | Resp 18 | Wt 142.8 lb

## 2019-11-20 DIAGNOSIS — Z1231 Encounter for screening mammogram for malignant neoplasm of breast: Secondary | ICD-10-CM | POA: Insufficient documentation

## 2019-11-20 DIAGNOSIS — A630 Anogenital (venereal) warts: Secondary | ICD-10-CM | POA: Insufficient documentation

## 2019-11-20 DIAGNOSIS — N903 Dysplasia of vulva, unspecified: Secondary | ICD-10-CM | POA: Insufficient documentation

## 2019-11-20 DIAGNOSIS — Z9079 Acquired absence of other genital organ(s): Secondary | ICD-10-CM | POA: Insufficient documentation

## 2019-11-20 DIAGNOSIS — Z01419 Encounter for gynecological examination (general) (routine) without abnormal findings: Secondary | ICD-10-CM | POA: Diagnosis present

## 2019-11-20 MED ORDER — CLONAZEPAM 1 MG PO TABS: ORAL_TABLET | ORAL | 0 refills | Status: DC

## 2019-11-20 NOTE — Telephone Encounter (Signed)
Dr. Cherry, Please advise. Thanks Sharnae Winfree 

## 2019-11-20 NOTE — Progress Notes (Signed)
Pt in for follow up post surgical procedure.  States still having some pain at surgical area rates a 2 today.  Pt also reports having issues with diarrhea and gas since surgery as well and is concerned.

## 2019-11-20 NOTE — Telephone Encounter (Signed)
Her Prozac she should still have an additional refill left.  Her clonazepam I will refill for now but is not usually a medication I prescribe. We can discuss this further at her appointment.

## 2019-11-20 NOTE — Telephone Encounter (Signed)
Colleen Mcneil patient: Wants to move fwd with Dr. Marcelline Mates needs a medication refill of prozac. Advised pt to schel a med refill visit with Dr. Marcelline Mates

## 2019-11-20 NOTE — Telephone Encounter (Signed)
Spoke with pt to see if had enough medication to cover her until her appointment on 12/06/19. Pt is needing refill of Prozac and clonazepam.

## 2019-11-20 NOTE — Progress Notes (Signed)
Gynecologic Oncology Interval Visit   Referring Provider: Lorelle Gibbs, CNM  Chief Complaint: Vulvar lesion with dysplasia  Subjective:  Colleen Mcneil is a 64 y.o. female, initially seen in consultation from [place], [place], for moderate perianal dysplasia, who is now status post surgery with Dr. Fransisca Connors at [facility] on 10/08/2019, who returns to clinic for discussion of path results and postop exam.  On 10/08/2019 she underwent EUA and simple vulvectomy.  Dr. Owens Shark with plastic surgery assisted. 2 weeks postop sutures were removed with plastic surgery.  She had a superficial separation of her incision.  She has been using and alternating Tylenol and ibuprofen.  Complains of some loose bowels accompanied by mild fecal incontinence and flatus. No vaginal drainage.  Today, she continues to report mild 2/10 surgical pain when sitting. Otherwise feels well. Mammogram scheduled for today.   Pathology: A. Left vulva, partial vulvectomy: Condyloma (low grade squamous intraepithelial lesion). The lateral and deep margins appear negative for intraepithelial lesion. The lesion is located 0.3 cm from the 12 o'clock, 0.4 cm from the 6 o'clock, <0.1 cm from the right lateral (12 to 3 to 6 o'clock), and <0.1 cm from the left lateral (6 to 9 to 12 o'clock) margins.   B. Posterior margin: Benign squamous mucosa, negative for intraepithelial lesion.  Gynecologic history: Patient initially presented for left perineal/gluteal lesion that has been enlarging over 2 years.  Measured 5 cm x 4-1/2 cm x 3.4 cm with irregular borders, pleuritic but not painful.  Biopsy was performed on 08/15/2019.  History of genital warts at age 89 treated with podophyllin.  She has had persistent lesion there for many years. Not painful, but not sexually active for a few years. Husband has severe arthritis.  Son died earlier this year due to drug addiction.  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   A. SKIN, LEFT PERINEUM BOTTOM,  BIOPSY:  - Moderate squamous dysplasia, focal.   B. SKIN, LEFT PERINEUM TOP, BIOPSY:  - Moderate squamous dysplasia, focal.  Last hgA1c was 6.1. TSH 2.360. Last Pap was 02/27/2017 - NILM, HR HPV Negative. She is a current smoker 1/2 PPD.   Problem List: Patient Active Problem List   Diagnosis Date Noted  . 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 Pre-diabetes 07/04/2018  . Special screening for malignant neoplasms, colon   . Benign neoplasm of sigmoid colon     Past Medical History: Past Medical History:  Diagnosis Date  . Anxiety   . Depression   . Elevated cholesterol   . Genital warts 1976   pt was treated with dyphylin  . Menopause syndrome   . Vitamin D deficiency   . Vulvar lesion     Past Surgical History: Past Surgical History:  Procedure Laterality Date  . CESAREAN SECTION  1986  . COLONOSCOPY WITH PROPOFOL N/A 05/20/2016   Procedure: COLONOSCOPY WITH PROPOFOL;  Surgeon: Lucilla Lame, MD;  Location: [facility];  Service: Endoscopy;  Laterality: N/A;  . HERNIA REPAIR    . POLYPECTOMY  05/20/2016   Procedure: POLYPECTOMY;  Surgeon: Lucilla Lame, MD;  Location: [facility] [facility];  Service: Endoscopy;;  . TUBAL LIGATION      OB History:  OB History  Gravida Para Term Preterm AB Living  3 3          SAB TAB Ectopic Multiple Live Births               # Outcome Date GA Lbr Len/2nd Weight Sex Delivery  Anes PTL Lv  3 Para 1986    M CS-Unspec     2 Para 1982    F Vag-Spont     1 Para 1981    M Vag-Spont       Family History: Family History  Problem Relation Age of Onset  . Heart disease Mother   . Diabetes Mother   . Diabetes Father   . Liver disease Son   . Breast cancer Neg Hx     Social History: Social History   Socioeconomic History  . Marital status: Married    Spouse name: Not on file  . Number of children: Not on file  . Years of education: Not on file  . Highest education level: Not on file  Occupational History  . Not on file   Tobacco Use  . Smoking status: Current Every Day Smoker    Packs/day: 0.50    Years: 25.00    Pack years: 12.50    Types: Cigarettes  . Smokeless tobacco: Never Used  Substance and Sexual Activity  . Alcohol use: No  . Drug use: No  . Sexual activity: Not Currently  Other Topics Concern  . Not on file  Social History Narrative  . Not on file   Social Determinants of Health   Financial Resource Strain:   . Difficulty of Paying Living Expenses: Not on file  Food Insecurity:   . Worried About Charity fundraiser in the Last Year: Not on file  . Ran Out of Food in the Last Year: Not on file  Transportation Needs:   . Lack of Transportation (Medical): Not on file  . Lack of Transportation (Non-Medical): Not on file  Physical Activity:   . Days of Exercise per Week: Not on file  . Minutes of Exercise per Session: Not on file  Stress:   . Feeling of Stress : Not on file  Social Connections:   . Frequency of Communication with Friends and Family: Not on file  . Frequency of Social Gatherings with Friends and Family: Not on file  . Attends Religious Services: Not on file  . Active Member of Clubs or Organizations: Not on file  . Attends Archivist Meetings: Not on file  . Marital Status: Not on file  Intimate Partner Violence:   . Fear of Current or Ex-Partner: Not on file  . Emotionally Abused: Not on file  . Physically Abused: Not on file  . Sexually Abused: Not on file    Allergies: Allergies  Allergen Reactions  . Macrobid [Nitrofurantoin Monohyd Macro] Nausea And Vomiting    Current Medications: Current Outpatient Medications  Medication Sig Dispense Refill  . Cholecalciferol (VITAMIN D3) 5000 units CAPS Take 1 capsule (5,000 Units total) by mouth daily. 120 capsule 2  . clonazePAM (KLONOPIN) 1 MG tablet TAKE 1 TABLET TWICE DAILY AS NEEDED FOR ANXIETY 60 tablet 2  . FLUoxetine (PROZAC) 10 MG capsule TAKE (2) CAPSULES BY MOUTH ONCE DAILY. 60 capsule 4    No current facility-administered medications for this visit.   Review of Systems General:  no complaints Skin: no complaints Eyes: no complaints HEENT: no complaints Breasts: no complaints Pulmonary: no complaints Cardiac: no complaints Gastrointestinal: diarrhea & gas Genitourinary/Sexual: no complaints Ob/Gyn: mild surgical pain Musculoskeletal: no complaints Hematology: no complaints Neurologic/Psych: no complaints  Objective:  Physical Examination:  BP 120/73 (BP Location: Left Arm, Patient Position: Sitting)   Pulse (!) 56   Temp 98.4 F (36.9 C) (Tympanic)  Resp 18   Wt 142 lb 12.8 oz (64.8 kg)   SpO2 100%   BMI 23.05 kg/m     ECOG Performance Status: 0 - Asymptomatic  GENERAL: Patient is a well appearing female in no acute distress HEENT:  PERRL, neck supple with midline trachea. Thyroid without masses.  NODES:  No cervical, supraclavicular, axillary, or inguinal lymphadenopathy palpated.  LUNGS:  Clear to auscultation bilaterally.  No wheezes or rhonchi. HEART:  Regular rate and rhythm. No murmur appreciated. ABDOMEN:  Soft, nontender.  Positive, normoactive bowel sounds.  MSK:  No focal spinal tenderness to palpation. Full range of motion bilaterally in the upper extremities. EXTREMITIES:  No peripheral edema.   SKIN:  Clear with no obvious rashes or skin changes. No nail dyscrasia. NEURO:  Nonfocal. Well oriented.  Appropriate affect.  Pelvic: EGBUS: Perineum and perianal area well healed with intact incision. No lesions.  Vagina: no lesions, no discharge.  Assessment:  LEATHA BUCKMASTER is a 64 y.o. female diagnosed with vulvar/perianal dysplasia/warts.  It was a large broad raised area that was somewhat concerning for early invasive cancer.  Remote history of genital warts treated with podophyllin. In view of this we performed WLE 10/08/19 and Plastics assisted in closure, but no flap was needed.  The final path showed condyloma/dysplasia with no cancer.  Margins were negative.    Incision well healed.  She has some mild fecal and gas incontinence.  Nothing suspicious for fistula on exam.       Medical co-morbidities complicating care: smoker.  Plan:   Problem List Items Addressed This Visit    None    Visit Diagnoses    Vulvar dysplasia    -  Primary     We discussed that there was no cancer and that she can follow up in 6 months.  She will use some imodium to reduce diarrhea and this should help with mild fecal incontinence.  We did not remove any of the external anal sphincter, but she is thin and we did remove tissue in that area and there is undoubtedly some underlying scar tissue, even though the skin healed up well.  If she continues to have problems, we can have her seen by Dr Sharlett Iles in pelvic and reconstructive gyn.       Mellody Drown, MD  CC:  [facility] [place] [place],   [id number] [phone]

## 2019-11-21 NOTE — Telephone Encounter (Signed)
Pt called husband answered the phone and stated that she was not there. lft message with husband to have pt call the office to speak about her medication refill. He stated that he would inform her.

## 2019-11-26 NOTE — Telephone Encounter (Signed)
Pt called no answer LM via VM. (not a detailed message due to no name was on the vm) that one of her prescription still had a refill and the other one Dr. Marcelline Mates stated that it is not a medication that she normal prescribed and that her and [facility] could talk about the medication.

## 2019-11-29 NOTE — Telephone Encounter (Signed)
Pt called to see if she received the VM let on the 19th. Pt stated that she did and picked up her medication already from the pharmacy.

## 2019-12-05 NOTE — Progress Notes (Signed)
Pt is present for anxiety medication refill. PHQ-9 and GAD-7 completed.  PHQ-9=0 GAD-7=3

## 2019-12-06 ENCOUNTER — Ambulatory Visit: Payer: 59 | Admitting: Obstetrics and Gynecology

## 2019-12-06 ENCOUNTER — Encounter: Payer: Self-pay | Admitting: Obstetrics and Gynecology

## 2019-12-06 ENCOUNTER — Other Ambulatory Visit: Payer: Self-pay

## 2019-12-06 VITALS — BP 123/71 | HR 67 | Ht 66.0 in | Wt 143.0 lb

## 2019-12-06 DIAGNOSIS — F419 Anxiety disorder, unspecified: Secondary | ICD-10-CM

## 2019-12-06 MED ORDER — FLUOXETINE HCL 10 MG PO CAPS: ORAL_CAPSULE | ORAL | 3 refills | Status: DC

## 2019-12-06 MED ORDER — CLONAZEPAM 1 MG PO TABS: ORAL_TABLET | ORAL | 3 refills | Status: DC

## 2019-12-06 NOTE — Progress Notes (Signed)
    GYNECOLOGY PROGRESS NOTE  Subjective:    Patient ID: CEARIA HARNACK, female    DOB: 04-29-1956, 64 y.o.   MRN: [id number]  HPI  Patient is a 64 y.o. G3P3 female who presents for medication review and refill. She was a previous patient of [facility], CNM.  Ercia currently is on Prozac and Klonopin for management of her anxiety.  She has been on Prozac for "quite a while".  Has been on Klonopin for several years.  She notes that she has been through a lot. She has had several deaths in the family over the past few years, including her son last March, and a grandchild ~ 2 years ago.  Also works a stressful job Merchandiser, retail).  Notes she was put on the Klonopin by a PCP.   GAD 7 : Generalized Anxiety Score 12/06/2019  Nervous, Anxious, on Edge 1  Control/stop worrying 0  Worry too much - different things 1  Trouble relaxing 0  Restless 0  Easily annoyed or irritable 1  Afraid - awful might happen 0  Total GAD 7 Score 3  Anxiety Difficulty Not difficult at all      The following portions of the patient's history were reviewed and updated as appropriate: allergies, current medications, past family history, past medical history, past social history, past surgical history and problem list.  Review of Systems Pertinent items noted in HPI and remainder of comprehensive ROS otherwise negative.   Objective:   Blood pressure 123/71, pulse 67, height 5\' 6"  (1.676 m), weight 143 lb (64.9 kg). General appearance:  Neurologic: Grossly normal Psychiatric: normal mood and thought content. Normal speech.   Assessment:   History of anxiety  Plan:   Patient with history of anxiety, currently managed on Prozac and Klonopin.  Patient is transferring her care to [facility] as previous provider no longer at [facility].  Discussion had with patient regarding current medications.  Overall doing well on meds.  Discussed risks of prolonged use of benzodiazepines (although Klonopin less of abuse  potential), discussed alternative medication options. Patient would prefer to remain on her current meds as she has a good regimen that works well for her.  Notes that she has started to cut down on her use of Klonopin (was taking 2 tabs daily, now down to 1.5 tabs). Will give refill.  Return to clinic for any scheduled appointments or for any gynecologic concerns as needed.  She is scheduled for an annual exam in April.     A total of 15 minutes were spent face-to-face with the patient during this encounter and over half of that time dealt with counseling and coordination of care.   Rubie Maid, MD [facility]

## 2019-12-06 NOTE — Patient Instructions (Signed)

## 2020-05-20 ENCOUNTER — Ambulatory Visit: Payer: Self-pay

## 2020-06-02 ENCOUNTER — Telehealth: Payer: Self-pay | Admitting: Obstetrics and Gynecology

## 2020-06-02 NOTE — Telephone Encounter (Signed)
Pt called in and wanted to know when her next appt was. The pt was asked her name & her DOB and verified her address. The pt stated that she needs a refill on 2 meds her Prozac & Klonopin sent to [facility]. The pt stated that she has an annual 10/5 the pt is requesting a refill till her appt. Please advise

## 2020-06-03 ENCOUNTER — Encounter (INDEPENDENT_AMBULATORY_CARE_PROVIDER_SITE_OTHER): Payer: Self-pay

## 2020-06-03 ENCOUNTER — Other Ambulatory Visit: Payer: Self-pay

## 2020-06-03 ENCOUNTER — Encounter: Payer: Self-pay | Admitting: Obstetrics and Gynecology

## 2020-06-03 ENCOUNTER — Inpatient Hospital Stay: Payer: 59 | Attending: Obstetrics and Gynecology | Admitting: Obstetrics and Gynecology

## 2020-06-03 VITALS — BP 108/72 | HR 61 | Temp 97.6°F | Resp 20 | Wt 144.6 lb

## 2020-06-03 DIAGNOSIS — N903 Dysplasia of vulva, unspecified: Secondary | ICD-10-CM | POA: Diagnosis present

## 2020-06-03 DIAGNOSIS — F17210 Nicotine dependence, cigarettes, uncomplicated: Secondary | ICD-10-CM | POA: Diagnosis not present

## 2020-06-03 DIAGNOSIS — A630 Anogenital (venereal) warts: Secondary | ICD-10-CM | POA: Diagnosis not present

## 2020-06-03 DIAGNOSIS — N841 Polyp of cervix uteri: Secondary | ICD-10-CM | POA: Insufficient documentation

## 2020-06-03 DIAGNOSIS — Z9079 Acquired absence of other genital organ(s): Secondary | ICD-10-CM | POA: Diagnosis not present

## 2020-06-03 MED ORDER — CLONAZEPAM 1 MG PO TABS: ORAL_TABLET | ORAL | 0 refills | Status: DC

## 2020-06-03 MED ORDER — FLUOXETINE HCL 10 MG PO CAPS: ORAL_CAPSULE | ORAL | 3 refills | Status: DC

## 2020-06-03 NOTE — Progress Notes (Signed)
Gynecologic Oncology Interval Visit   Referring Provider: Lorelle Gibbs, CNM  Chief Complaint: Vulvar lesion with dysplasia  Subjective:  Colleen Mcneil is a 64 y.o. 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   On 10/08/2019 she underwent EUA and simple vulvectomy.  Dr. Owens Shark with plastic surgery assisted. 2 weeks postop sutures were removed with plastic surgery.  She had a superficial separation of her incision.  No new complaints today.  Just a bit sore on left vulva.     Pathology: A. Left vulva, partial vulvectomy: Condyloma (low grade squamous intraepithelial lesion). The lateral and deep margins appear negative for intraepithelial lesion. The lesion is located 0.3 cm from the 12 o'clock, 0.4 cm from the 6 o'clock, <0.1 cm from the right lateral (12 to 3 to 6 o'clock), and <0.1 cm from the left lateral (6 to 9 to 12 o'clock) margins.   B. Posterior margin: Benign squamous mucosa, negative for intraepithelial lesion.  Gynecologic history: Patient initially presented for left perineal/gluteal lesion that has been enlarging over 2 years.  Measured 5 cm x 4-1/2 cm x 3.4 cm with irregular borders, pleuritic but not painful.  Biopsy was performed on 08/15/2019.  History of genital warts at age 47 treated with podophyllin.  She has had persistent lesion there for many years. Not painful, but not sexually active for a few years. Husband has severe arthritis.  Son died earlier this year due to drug addiction.  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   A. SKIN, LEFT PERINEUM BOTTOM, BIOPSY:  - Moderate squamous dysplasia, focal.   B. SKIN, LEFT PERINEUM TOP, BIOPSY:  - Moderate squamous dysplasia, focal.  Last hgA1c was 6.1. TSH 2.360. Last Pap was 02/27/2017 - NILM, HR HPV Negative. She is a current smoker 1/2 PPD.   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   Pre-diabetes 07/04/2018   Special screening for malignant neoplasms, colon     Past Medical History: Past Medical History:  Diagnosis Date   Anxiety    Depression    Elevated cholesterol    Genital warts 1976   pt was treated with dyphylin   Menopause syndrome    Vitamin D deficiency    Vulvar lesion     Past Surgical History: Past Surgical History:  Procedure Laterality Date   CESAREAN SECTION  1986   COLONOSCOPY WITH PROPOFOL N/A 05/20/2016   Procedure: COLONOSCOPY WITH PROPOFOL;  Surgeon: Lucilla Lame, MD;  Location: [facility];  Service: Endoscopy;  Laterality: N/A;   HERNIA REPAIR     POLYPECTOMY  05/20/2016   Procedure: POLYPECTOMY;  Surgeon: Lucilla Lame, MD;  Location: [facility];  Service: Endoscopy;;   TUBAL LIGATION      OB History:  OB History  Gravida Para Term Preterm AB Living  3 3          SAB TAB Ectopic Multiple Live Births               # Outcome Date GA Lbr Len/2nd Weight Sex Delivery Anes PTL Lv  [place]  M Vag-Spont       Family History: Family History  Problem Relation Age of Onset   Heart disease Mother    Diabetes Mother    Diabetes Father    Liver disease Son    Breast cancer Neg Hx     Social History: Social History   Socioeconomic History   Marital status: Married    Spouse name: Not on file   Number of children: Not on file   Years of education: Not on file   Highest education level: Not on file  Occupational History   Not on file  Tobacco Use   Smoking status: Current Every Day Smoker    Packs/day: 0.50    Years: 25.00    Pack years: 12.50    Types: Cigarettes   Smokeless tobacco: Never Used  Scientific laboratory technician Use: Never used   Substance and Sexual Activity   Alcohol use: No   Drug use: No   Sexual activity: Not Currently  Other Topics Concern   Not on file  Social History Narrative   Not on file   Social Determinants of Health   Financial Resource Strain:    Difficulty of Paying Living Expenses:   Food Insecurity:    Worried About Charity fundraiser in the Last Year:    Arboriculturist in the Last Year:   Transportation Needs:    Film/video editor (Medical):    Lack of Transportation (Non-Medical):   Physical Activity:    Days of Exercise per Week:    Minutes of Exercise per Session:   Stress:    Feeling of Stress :   Social Connections:    Frequency of Communication with Friends and Family:    Frequency of Social Gatherings with Friends and Family:    Attends Religious Services:    Active Member of Clubs or Organizations:    Attends Archivist Meetings:    Marital Status:   Intimate Partner Violence:    Fear of Current or Ex-Partner:    Emotionally Abused:    Physically Abused:    Sexually Abused:     Allergies: Allergies  Allergen Reactions   Macrobid [Nitrofurantoin Monohyd Macro] Nausea And Vomiting   Nitrofurantoin Nausea And Vomiting   Sulfamethoxazole-Trimethoprim     Current Medications: Current Outpatient Medications  Medication Sig Dispense Refill   acetaminophen (TYLENOL) 325 MG tablet      Cholecalciferol (VITAMIN D3) 5000 units CAPS Take 1 capsule (5,000 Units total) by mouth daily. 120 capsule 2   clonazePAM (KLONOPIN) 1 MG tablet TAKE 1 TABLET TWICE DAILY AS NEEDED FOR ANXIETY 60 tablet 3   FLUoxetine (PROZAC) 10 MG capsule TAKE (2) CAPSULES BY MOUTH ONCE DAILY. 90 capsule 3   ibuprofen (ADVIL) 600 MG tablet      No current facility-administered medications for this visit.   Review of Systems General:  no complaints Skin: no complaints Eyes: no complaints HEENT: no complaints Breasts: no complaints Pulmonary: no  complaints Cardiac: no complaints Gastrointestinal: diarrhea & gas Genitourinary/Sexual: no complaints Musculoskeletal: no complaints Hematology: no complaints Neurologic/Psych: no complaints  Objective:  Physical Examination:  BP 108/72    Pulse 61    Temp 97.6 F (36.4 C)    Resp 20    Wt 144 lb 9.6 oz (65.6 kg)    SpO2 97%    BMI 23.34 kg/m     ECOG Performance Status: 0 - Asymptomatic  GENERAL: Patient is a well appearing  female in no acute distress HEENT:  PERRL, neck supple with midline trachea. Thyroid without masses.  NODES:  No cervical, supraclavicular, axillary, or inguinal lymphadenopathy palpated.  LUNGS:  Clear to auscultation bilaterally.  No wheezes or rhonchi. HEART:  Regular rate and rhythm. No murmur appreciated. ABDOMEN:  Soft, nontender.  Positive, normoactive bowel sounds.  MSK:  No focal spinal tenderness to palpation. Full range of motion bilaterally in the upper extremities. EXTREMITIES:  No peripheral edema.   SKIN:  Clear with no obvious rashes or skin changes. No nail dyscrasia. NEURO:  Nonfocal. Well oriented.  Appropriate affect.  Pelvic: EGBUS: Perineum and perianal area well healed. No lesions.  Vagina: no lesions, no discharge. Cervix: small 2 mm polyp in endocervix Uterus: small and mobile Adnexa/RV: normal  Procedure: small endocervical polyp biopsied off with informed consent.  And PAP done.  She tolerated well and no bleeding.   Assessment:  HAILA DENA is a 64 y.o. female diagnosed with vulvar/perianal dysplasia/warts.  It was a large broad raised area that was somewhat concerning for early invasive cancer.  Remote history of genital warts treated with podophyllin. In view of this we performed WLE 10/08/19 and Plastics assisted in closure, but no flap was needed.  The final path showed condyloma/dysplasia with no cancer. Margins were negative.  NED today.   Small endocervical polyp biopsied off, looks benign.   She has some mild  fecal and gas incontinence.  Nothing suspicious for fistula on exam.       Medical co-morbidities complicating care: smoker.  Plan:   Problem List Items Addressed This Visit    None    Visit Diagnoses    Vulvar dysplasia    -  Primary   Relevant Orders   Surgical pathology   IGP, Aptima HPV   Cervical polyp       Relevant Orders   Surgical pathology      Normal vulvar exam and will RTC in 6 months.  PAP and endocervical polyp biopsies done and will follow up results, which are anticipated to be benign.       Mellody Drown, MD  CC:  [facility] [place] [place],  [place] [id number] [phone]

## 2020-06-03 NOTE — Addendum Note (Signed)
Addended by: Augusto Gamble on: 06/03/2020 10:33 PM   Modules accepted: Orders

## 2020-06-03 NOTE — Telephone Encounter (Signed)
I have sent in the refill for the prozac. Please advise on Klonopin refill.

## 2020-06-04 ENCOUNTER — Telehealth: Payer: Self-pay | Admitting: Obstetrics and Gynecology

## 2020-06-04 NOTE — Telephone Encounter (Signed)
Pt called no answer LM via VM to inform her that all medication that was requested Prozac and Klonopin was refilled. Pt was advised to check with pharmacy on status. Pt was informed via mychart if she had any issues to please contact the office.

## 2020-06-04 NOTE — Telephone Encounter (Signed)
Pt called in a stated that she missed a call.  I went to check to see if a nurse called her. The message stated that her refills are in to please contact her pharmacy. The pt verbally understood.

## 2020-06-05 ENCOUNTER — Telehealth: Payer: Self-pay | Admitting: Nurse Practitioner

## 2020-06-05 LAB — IGP, APTIMA HPV: HPV Aptima: NEGATIVE

## 2020-06-05 LAB — SURGICAL PATHOLOGY

## 2020-06-05 NOTE — Telephone Encounter (Signed)
Spoke to patient re: biopsy results. She has healed well w/o complication after biopsy. Pathology was normal/benign. Pap pending. Follow up as planned in 6 months at this point.

## 2020-08-11 ENCOUNTER — Encounter: Payer: Self-pay | Admitting: Obstetrics and Gynecology

## 2020-08-11 ENCOUNTER — Other Ambulatory Visit: Payer: Self-pay

## 2020-08-11 ENCOUNTER — Ambulatory Visit (INDEPENDENT_AMBULATORY_CARE_PROVIDER_SITE_OTHER): Payer: 59 | Admitting: Obstetrics and Gynecology

## 2020-08-11 VITALS — BP 137/77 | HR 61 | Ht 66.0 in | Wt 142.3 lb

## 2020-08-11 DIAGNOSIS — R7303 Prediabetes: Secondary | ICD-10-CM

## 2020-08-11 DIAGNOSIS — E785 Hyperlipidemia, unspecified: Secondary | ICD-10-CM

## 2020-08-11 DIAGNOSIS — N3941 Urge incontinence: Secondary | ICD-10-CM

## 2020-08-11 DIAGNOSIS — Z01419 Encounter for gynecological examination (general) (routine) without abnormal findings: Secondary | ICD-10-CM | POA: Diagnosis not present

## 2020-08-11 DIAGNOSIS — Z8639 Personal history of other endocrine, nutritional and metabolic disease: Secondary | ICD-10-CM | POA: Diagnosis not present

## 2020-08-11 DIAGNOSIS — M8580 Other specified disorders of bone density and structure, unspecified site: Secondary | ICD-10-CM

## 2020-08-11 DIAGNOSIS — F419 Anxiety disorder, unspecified: Secondary | ICD-10-CM

## 2020-08-11 DIAGNOSIS — N8110 Cystocele, unspecified: Secondary | ICD-10-CM

## 2020-08-11 DIAGNOSIS — D229 Melanocytic nevi, unspecified: Secondary | ICD-10-CM

## 2020-08-11 NOTE — Progress Notes (Signed)
Pt present for annual exam. Pt stated that she was doing well. Covid vaccine completed in March 2021. Flu vaccine scheduled to be completed 08/27/2020.

## 2020-08-11 NOTE — Patient Instructions (Addendum)
Preventive Care 40-64 Years Old, Female Preventive care refers to visits with your health care provider and lifestyle choices that can promote health and wellness. This includes:  A yearly physical exam. This may also be called an annual well check.  Regular dental visits and eye exams.  Immunizations.  Screening for certain conditions.  Healthy lifestyle choices, such as eating a healthy diet, getting regular exercise, not using drugs or products that contain nicotine and tobacco, and limiting alcohol use. What can I expect for my preventive care visit? Physical exam Your health care provider will check your:  Height and weight. This may be used to calculate body mass index (BMI), which tells if you are at a healthy weight.  Heart rate and blood pressure.  Skin for abnormal spots. Counseling Your health care provider may ask you questions about your:  Alcohol, tobacco, and drug use.  Emotional well-being.  Home and relationship well-being.  Sexual activity.  Eating habits.  Work and work environment.  Method of birth control.  Menstrual cycle.  Pregnancy history. What immunizations do I need?  Influenza (flu) vaccine  This is recommended every year. Tetanus, diphtheria, and pertussis (Tdap) vaccine  You may need a Td booster every 10 years. Varicella (chickenpox) vaccine  You may need this if you have not been vaccinated. Zoster (shingles) vaccine  You may need this after age 60. Measles, mumps, and rubella (MMR) vaccine  You may need at least one dose of MMR if you were born in 1957 or later. You may also need a second dose. Pneumococcal conjugate (PCV13) vaccine  You may need this if you have certain conditions and were not previously vaccinated. Pneumococcal polysaccharide (PPSV23) vaccine  You may need one or two doses if you smoke cigarettes or if you have certain conditions. Meningococcal conjugate (MenACWY) vaccine  You may need this if you  have certain conditions. Hepatitis A vaccine  You may need this if you have certain conditions or if you travel or work in places where you may be exposed to hepatitis A. Hepatitis B vaccine  You may need this if you have certain conditions or if you travel or work in places where you may be exposed to hepatitis B. Haemophilus influenzae type b (Hib) vaccine  You may need this if you have certain conditions. Human papillomavirus (HPV) vaccine  If recommended by your health care provider, you may need three doses over 6 months. You may receive vaccines as individual doses or as more than one vaccine together in one shot (combination vaccines). Talk with your health care provider about the risks and benefits of combination vaccines. What tests do I need? Blood tests  Lipid and cholesterol levels. These may be checked every 5 years, or more frequently if you are over 50 years old.  Hepatitis C test.  Hepatitis B test. Screening  Lung cancer screening. You may have this screening every year starting at age 55 if you have a 30-pack-year history of smoking and currently smoke or have quit within the past 15 years.  Colorectal cancer screening. All adults should have this screening starting at age 50 and continuing until age 75. Your health care provider may recommend screening at age 45 if you are at increased risk. You will have tests every 1-10 years, depending on your results and the type of screening test.  Diabetes screening. This is done by checking your blood sugar (glucose) after you have not eaten for a while (fasting). You may have this   done every 1-3 years.  Mammogram. This may be done every 1-2 years. Talk with your health care provider about when you should start having regular mammograms. This may depend on whether you have a family history of breast cancer.  BRCA-related cancer screening. This may be done if you have a family history of breast, ovarian, tubal, or peritoneal  cancers.  Pelvic exam and Pap test. This may be done every 3 years starting at age 70. Starting at age 87, this may be done every 5 years if you have a Pap test in combination with an HPV test. Other tests  Sexually transmitted disease (STD) testing.  Bone density scan. This is done to screen for osteoporosis. You may have this scan if you are at high risk for osteoporosis. Follow these instructions at home: Eating and drinking  Eat a diet that includes fresh fruits and vegetables, whole grains, lean protein, and low-fat dairy.  Take vitamin and mineral supplements as recommended by your health care provider.  Do not drink alcohol if: ? Your health care provider tells you not to drink. ? You are pregnant, may be pregnant, or are planning to become pregnant.  If you drink alcohol: ? Limit how much you have to 0-1 drink a day. ? Be aware of how much alcohol is in your drink. In the [place] one drink equals one 12 oz bottle of beer (355 mL), one 5 oz glass of wine (148 mL), or one 1 oz glass of hard liquor (44 mL). Lifestyle  Take daily care of your teeth and gums.  Stay active. Exercise for at least 30 minutes on 5 or more days each week.  Do not use any products that contain nicotine or tobacco, such as cigarettes, e-cigarettes, and chewing tobacco. If you need help quitting, ask your health care provider.  If you are sexually active, practice safe sex. Use a condom or other form of birth control (contraception) in order to prevent pregnancy and STIs (sexually transmitted infections).  If told by your health care provider, take low-dose aspirin daily starting at age 59. What's next?  Visit your health care provider once a year for a well check visit.  Ask your health care provider how often you should have your eyes and teeth checked.  Stay up to date on all vaccines. This information is not intended to replace advice given to you by your health care provider. Make sure you  discuss any questions you have with your health care provider. Document Revised: 07/05/2018 Document Reviewed: 07/05/2018 Elsevier Patient Education  2020 [employer] Breast self-awareness is knowing how your breasts look and feel. Doing breast self-awareness is important. It allows you to catch a breast problem early while it is still small and can be treated. All women should do breast self-awareness, including women who have had breast implants. Tell your doctor if you notice a change in your breasts. What you need:  A mirror.  A well-lit room. How to do a breast self-exam A breast self-exam is one way to learn what is normal for your breasts and to check for changes. To do a breast self-exam: Look for changes  1. Take off all the clothes above your waist. 2. Stand in front of a mirror in a room with good lighting. 3. Put your hands on your hips. 4. Push your hands down. 5. Look at your breasts and nipples in the mirror to see if one breast or nipple looks different  from the other. Check to see if: ? The shape of one breast is different. ? The size of one breast is different. ? There are wrinkles, dips, and bumps in one breast and not the other. 6. Look at each breast for changes in the skin, such as: ? Redness. ? Scaly areas. 7. Look for changes in your nipples, such as: ? Liquid around the nipples. ? Bleeding. ? Dimpling. ? Redness. ? A change in where the nipples are. Feel for changes  1. Lie on your back on the floor. 2. Feel each breast. To do this, follow these steps: ? Pick a breast to feel. ? Put the arm closest to that breast above your head. ? Use your other arm to feel the nipple area of your breast. Feel the area with the pads of your three middle fingers by making small circles with your fingers. For the first circle, press lightly. For the second circle, press harder. For the third circle, press even harder. ? Keep making circles  with your fingers at the different pressures as you move down your breast. Stop when you feel your ribs. ? Move your fingers a little toward the center of your body. ? Start making circles with your fingers again, this time going up until you reach your collarbone. ? Keep making up-and-down circles until you reach your armpit. Remember to keep using the three pressures. ? Feel the other breast in the same way. 3. Sit or stand in the tub or shower. 4. With soapy water on your skin, feel each breast the same way you did in step 2 when you were lying on the floor. Write down what you find Writing down what you find can help you remember what to tell your doctor. Write down:  What is normal for each breast.  Any changes you find in each breast, including: ? The kind of changes you find. ? Whether you have pain. ? Size and location of any lumps.  When you last had your menstrual period. General tips  Check your breasts every month.  If you are breastfeeding, the best time to check your breasts is after you feed your baby or after you use a breast pump.  If you get menstrual periods, the best time to check your breasts is 5-7 days after your menstrual period is over.  With time, you will become comfortable with the self-exam, and you will begin to know if there are changes in your breasts. Contact a doctor if you:  See a change in the shape or size of your breasts or nipples.  See a change in the skin of your breast or nipples, such as red or scaly skin.  Have fluid coming from your nipples that is not normal.  Find a lump or thick area that was not there before.  Have pain in your breasts.  Have any concerns about your breast health. Summary  Breast self-awareness includes looking for changes in your breasts, as well as feeling for changes within your breasts.  Breast self-awareness should be done in front of a mirror in a well-lit room.  You should check your breasts every  month. If you get menstrual periods, the best time to check your breasts is 5-7 days after your menstrual period is over.  Let your doctor know of any changes you see in your breasts, including changes in size, changes on the skin, pain or tenderness, or fluid from your nipples that is not normal. This information  is not intended to replace advice given to you by your health care provider. Make sure you discuss any questions you have with your health care provider. Document Revised: 06/12/2018 Document Reviewed: 06/12/2018 Elsevier Patient Education  [employer] [place].    Kegel Exercises  Kegel exercises can help strengthen your pelvic floor muscles. The pelvic floor is a group of muscles that support your rectum, small intestine, and bladder. In females, pelvic floor muscles also help support the womb (uterus). These muscles help you control the flow of urine and stool. Kegel exercises are painless and simple, and they do not require any equipment. Your provider may suggest Kegel exercises to: Improve bladder and bowel control. Improve sexual response. Improve weak pelvic floor muscles after surgery to remove the uterus (hysterectomy) or pregnancy (females). Improve weak pelvic floor muscles after prostate gland removal or surgery (males). Kegel exercises involve squeezing your pelvic floor muscles, which are the same muscles you squeeze when you try to stop the flow of urine or keep from passing gas. The exercises can be done while sitting, standing, or lying down, but it is best to vary your position. Exercises How to do Kegel exercises: Squeeze your pelvic floor muscles tight. You should feel a tight lift in your rectal area. If you are a female, you should also feel a tightness in your vaginal area. Keep your stomach, buttocks, and legs relaxed. Hold the muscles tight for up to 10 seconds. Breathe normally. Relax your muscles. Repeat as told by your health care provider. Repeat this  exercise daily as told by your health care provider. Continue to do this exercise for at least 4-6 weeks, or for as long as told by your health care provider. You may be referred to a physical therapist who can help you learn more about how to do Kegel exercises. Depending on your condition, your health care provider may recommend: Varying how long you squeeze your muscles. Doing several sets of exercises every day. Doing exercises for several weeks. Making Kegel exercises a part of your regular exercise routine. This information is not intended to replace advice given to you by your health care provider. Make sure you discuss any questions you have with your health care provider. Document Revised: 06/13/2018 Document Reviewed: 06/13/2018 Elsevier Patient Education  [employer].

## 2020-08-11 NOTE — Progress Notes (Signed)
ANNUAL PREVENTATIVE CARE GYNECOLOGY  ENCOUNTER NOTE  Subjective:       Colleen Mcneil is a 64 y.o. G3P3 female here for a routine annual gynecologic exam. The patient is not sexually active. The patient haas never taken hormone replacement therapy. Patient denies post-menopausal vaginal bleeding. The patient wears seatbelts: yes. The patient participates in regular exercise: reports working out in the yard.    Current complaints: 1. Notes she is dealing with her daughter and other grandchildren moving in after her 66 year old grandaughter passed last year and was dealing with grief and a dissolving marriage. Also her husband had back surgery so she is helping take care of him. Overall has been coping well as her daughter has been very helpful at home.  2. States sometimes having difficulty holding her bladder, has occasional small amounts of leakage on the way to the restroom.    Gynecologic History No LMP recorded. Patient is postmenopausal. Contraception: post menopausal status Last Pap: 06/03/2020. Results were: normal Last mammogram: 11/20/2019. Results were: normal Last Colonoscopy: 05/20/2016. Results were normal.  Last Dexa Scan: 08/24/2018. Osteopenia.  T score is -1.8.    Obstetric History OB History  Gravida Para Term Preterm AB Living  3 3          SAB TAB Ectopic Multiple Live Births               # Outcome Date GA Lbr Len/2nd Weight Sex Delivery Anes PTL Lv  3 Para 86    M CS-Unspec     2 Para 1982    F Vag-Spont     1 Para 1981    M Vag-Spont       Past Medical History:  Diagnosis Date  . Anxiety   . Depression   . Elevated cholesterol   . Genital warts 1976   pt was treated with dyphylin  . Menopause syndrome   . Vitamin D deficiency   . Vulvar lesion     Family History  Problem Relation Age of Onset  . Heart disease Mother   . Diabetes Mother   . Diabetes Father   . Liver disease Son   . Breast cancer Neg Hx     Past Surgical History:    Procedure Laterality Date  . CESAREAN SECTION  1986  . COLONOSCOPY WITH PROPOFOL N/A 05/20/2016   Procedure: COLONOSCOPY WITH PROPOFOL;  Surgeon: Lucilla Lame, MD;  Location: [facility];  Service: Endoscopy;  Laterality: N/A;  . HERNIA REPAIR    . POLYPECTOMY  05/20/2016   Procedure: POLYPECTOMY;  Surgeon: Lucilla Lame, MD;  Location: [facility];  Service: Endoscopy;;  . TUBAL LIGATION      Social History   Socioeconomic History  . Marital status: Married    Spouse name: Not on file  . Number of children: Not on file  . Years of education: Not on file  . Highest education level: Not on file  Occupational History  . Not on file  Tobacco Use  . Smoking status: Current Every Day Smoker    Packs/day: 0.50    Years: 25.00    Pack years: 12.50    Types: Cigarettes  . Smokeless tobacco: Never Used  Vaping Use  . Vaping Use: Never used  Substance and Sexual Activity  . Alcohol use: No  . Drug use: No  . Sexual activity: Not Currently  Other Topics Concern  . Not on file  Social History Narrative  . Not  on file   Social Determinants of Health   Financial Resource Strain:   . Difficulty of Paying Living Expenses: Not on file  Food Insecurity:   . Worried About Charity fundraiser in the Last Year: Not on file  . Ran Out of Food in the Last Year: Not on file  Transportation Needs:   . Lack of Transportation (Medical): Not on file  . Lack of Transportation (Non-Medical): Not on file  Physical Activity:   . Days of Exercise per Week: Not on file  . Minutes of Exercise per Session: Not on file  Stress:   . Feeling of Stress : Not on file  Social Connections:   . Frequency of Communication with Friends and Family: Not on file  . Frequency of Social Gatherings with Friends and Family: Not on file  . Attends Religious Services: Not on file  . Active Member of Clubs or Organizations: Not on file  . Attends Archivist Meetings: Not on file  . Marital  Status: Not on file  Intimate Partner Violence:   . Fear of Current or Ex-Partner: Not on file  . Emotionally Abused: Not on file  . Physically Abused: Not on file  . Sexually Abused: Not on file    Current Outpatient Medications on File Prior to Visit  Medication Sig Dispense Refill  . acetaminophen (TYLENOL) 325 MG tablet     . Cholecalciferol (VITAMIN D3) 5000 units CAPS Take 1 capsule (5,000 Units total) by mouth daily. 120 capsule 2  . clonazePAM (KLONOPIN) 1 MG tablet TAKE 1 TABLET TWICE DAILY AS NEEDED FOR ANXIETY 60 tablet 0  . FLUoxetine (PROZAC) 10 MG capsule TAKE (2) CAPSULES BY MOUTH ONCE DAILY. 90 capsule 3  . ibuprofen (ADVIL) 600 MG tablet      No current facility-administered medications on file prior to visit.    Allergies  Allergen Reactions  . Macrobid [Nitrofurantoin Monohyd Macro] Nausea And Vomiting  . Nitrofurantoin Nausea And Vomiting  . Sulfamethoxazole-Trimethoprim       Review of Systems ROS Review of Systems - General ROS: negative for - chills, fatigue, fever, hot flashes, night sweats, weight gain or weight loss Psychological ROS: negative for - anxiety, decreased libido, depression, mood swings, physical abuse or sexual abuse Ophthalmic ROS: negative for - blurry vision, eye pain or loss of vision ENT ROS: negative for - headaches, hearing change, visual changes or vocal changes Allergy and Immunology ROS: negative for - hives, itchy/watery eyes or seasonal allergies Hematological and Lymphatic ROS: negative for - bleeding problems, bruising, swollen lymph nodes or weight loss Endocrine ROS: negative for - galactorrhea, hair pattern changes, hot flashes, malaise/lethargy, mood swings, palpitations, polydipsia/polyuria, skin changes, temperature intolerance or unexpected weight changes Breast ROS: negative for - new or changing breast lumps or nipple discharge Respiratory ROS: negative for - cough or shortness of breath Cardiovascular ROS: negative  for - chest pain, irregular heartbeat, palpitations or shortness of breath Gastrointestinal ROS: no abdominal pain, change in bowel habits, or black or bloody stools Genito-Urinary ROS: no dysuria, trouble voiding, or hematuria. Positive for occasional urge incontinence.  Musculoskeletal ROS: negative for - joint pain or joint stiffness Neurological ROS: negative for - bowel and bladder control changes Dermatological ROS: negative for rash and skin lesion changes   Objective:   BP 137/77   Pulse 61   Ht 5\' 6"  (1.676 m)   Wt 142 lb 4.8 oz (64.5 kg)   BMI 22.97 kg/m  CONSTITUTIONAL: Well-developed,  well-nourished female in no acute distress.  PSYCHIATRIC: Normal mood and affect. Normal behavior. Normal judgment and thought content. [place]: Alert and oriented to person, place, and time. Normal muscle tone coordination. No cranial nerve deficit noted. HENT:  Normocephalic, atraumatic, External right and left ear normal. Oropharynx is clear and moist EYES: Conjunctivae and EOM are normal. Pupils are equal, round, and reactive to light. No scleral icterus.  NECK: Normal range of motion, supple, no masses.  Normal thyroid.  SKIN: Skin is warm and dry. No rash noted. Not diaphoretic. No erythema. No pallor.  Multiple atypical nevi on trunk.  CARDIOVASCULAR: Normal heart rate noted, regular rhythm, no murmur. RESPIRATORY: Clear to auscultation bilaterally. Effort and breath sounds normal, no problems with respiration noted. BREASTS: Symmetric in size. No masses, skin changes, nipple drainage, or lymphadenopathy. ABDOMEN: Soft, normal bowel sounds, no distention noted.  No tenderness, rebound or guarding.  BLADDER: Normal PELVIC:  Bladder no bladder distension noted  Urethra: normal appearing urethra with no masses, tenderness or lesions  Vulva: normal appearing vulva with no masses, tenderness or lesions  Vagina: normal appearing vagina with normal color and discharge, no lesions. Grade 1  cystocele.   Cervix: normal appearing cervix without discharge or lesions  Uterus: uterus is normal size, shape, consistency and nontender  Adnexa: normal adnexa in size, nontender and no masses  RV: External Exam NormaI, No Rectal Masses and Normal Sphincter tone  MUSCULOSKELETAL: Normal range of motion. No tenderness.  No cyanosis, clubbing, or edema.  2+ distal pulses. LYMPHATIC: No Axillary, Supraclavicular, or Inguinal Adenopathy.   Labs: Lab Results  Component Value Date   WBC 7.6 08/09/2019   HGB 12.5 08/09/2019   HCT 37.7 08/09/2019   MCV 91 08/09/2019   PLT 274 08/09/2019    Lab Results  Component Value Date   CREATININE 0.73 08/09/2019   BUN 18 08/09/2019   NA 143 08/09/2019   K 4.2 08/09/2019   CL 103 08/09/2019   CO2 27 08/09/2019    Lab Results  Component Value Date   ALT 9 08/09/2019   AST 17 08/09/2019   ALKPHOS 67 08/09/2019   BILITOT <0.2 08/09/2019    Lab Results  Component Value Date   CHOL 219 (H) 08/09/2019   HDL 46 08/09/2019   LDLCALC 150 (H) 08/09/2019   TRIG 130 08/09/2019   CHOLHDL 4.8 (H) 08/09/2019    Lab Results  Component Value Date   TSH 2.360 08/09/2019    Lab Results  Component Value Date   HGBA1C 6.1 (H) 08/09/2019     Assessment:   1. Encounter for well woman exam with routine gynecological exam   2. History of vitamin D deficiency   3. Prediabetes   4. Dyslipidemia   5. Anxiety, mild   6. Atypical nevi   7. Osteopenia determined by x-ray   8. Urge incontinence of urine   9. Female cystocele     Plan:  Pap: Not done. Up to date. Next due in 3 years.  Mammogram: Not Ordered. Up to date. Continue routine yearly screens.  Stool Guaiac Testing:  Not Indicated. Last colonoscopy 05/20/2016.  Up to date.  Labs: CBC, CMP, Lipid 1, FBS, Hemoglobin A1C and Vit D Level. Routine preventative health maintenance measures emphasized: Exercise/Diet/Weight control, Tobacco Warnings, Alcohol/Substance use risks and Stress  Management  Continue Vitamin D supplementation, would also encourage calcium intake.  Anxiety managed with Klonopin and Prozac. Doing well.  Pre-diabetes and dyslipidemia mild, will f/u with labs. Strongly  encourage lifestyle modifications.  Cystocele with mild urge incontinence, discussed Kegel exercises. Atypical nevi, would encourage f/u with Dermatologist yearly for surveillance.   COVID Vaccination status: has completed COVID vaccination series (Moderna) Plans to get flu shot at end of October.  Return to [place], MD  [facility]

## 2020-08-12 LAB — CBC
Hematocrit: 34.3 % (ref 34.0–46.6)
Hemoglobin: 11.9 g/dL (ref 11.1–15.9)
MCH: 31 pg (ref 26.6–33.0)
MCHC: 34.7 g/dL (ref 31.5–35.7)
MCV: 89 fL (ref 79–97)
Platelets: 263 10*3/uL (ref 150–450)
RBC: 3.84 x10E6/uL (ref 3.77–5.28)
RDW: 12.1 % (ref 11.7–15.4)
WBC: 6.6 10*3/uL (ref 3.4–10.8)

## 2020-08-12 LAB — LIPID PANEL
Chol/HDL Ratio: 4.9 ratio — ABNORMAL HIGH (ref 0.0–4.4)
Cholesterol, Total: 210 mg/dL — ABNORMAL HIGH (ref 100–199)
HDL: 43 mg/dL (ref >39)
LDL Chol Calc (NIH): 143 mg/dL — ABNORMAL HIGH (ref 0–99)
Triglycerides: 132 mg/dL (ref 0–149)
VLDL Cholesterol Cal: 24 mg/dL (ref 5–40)

## 2020-08-12 LAB — COMPREHENSIVE METABOLIC PANEL
ALT: 15 IU/L (ref 0–32)
AST: 15 IU/L (ref 0–40)
Albumin/Globulin Ratio: 2.3 — ABNORMAL HIGH (ref 1.2–2.2)
Albumin: 4.3 g/dL (ref 3.8–4.8)
Alkaline Phosphatase: 60 IU/L (ref 44–121)
BUN/Creatinine Ratio: 17 (ref 12–28)
BUN: 11 mg/dL (ref 8–27)
Bilirubin Total: <0.2 mg/dL (ref 0.0–1.2)
CO2: 25 mmol/L (ref 20–29)
Calcium: 9.1 mg/dL (ref 8.7–10.3)
Chloride: 105 mmol/L (ref 96–106)
Creatinine, Ser: 0.66 mg/dL (ref 0.57–1.00)
GFR calc Af Amer: 108 mL/min/{1.73_m2} (ref >59)
GFR calc non Af Amer: 94 mL/min/{1.73_m2} (ref >59)
Globulin, Total: 1.9 g/dL (ref 1.5–4.5)
Glucose: 96 mg/dL (ref 65–99)
Potassium: 4.2 mmol/L (ref 3.5–5.2)
Sodium: 142 mmol/L (ref 134–144)
Total Protein: 6.2 g/dL (ref 6.0–8.5)

## 2020-08-12 LAB — HEMOGLOBIN A1C
Est. average glucose Bld gHb Est-mCnc: 120 mg/dL
Hgb A1c MFr Bld: 5.8 % — ABNORMAL HIGH (ref 4.8–5.6)

## 2020-08-13 ENCOUNTER — Encounter: Payer: Self-pay | Admitting: Obstetrics and Gynecology

## 2020-08-13 DIAGNOSIS — D229 Melanocytic nevi, unspecified: Secondary | ICD-10-CM | POA: Insufficient documentation

## 2020-08-13 DIAGNOSIS — E785 Hyperlipidemia, unspecified: Secondary | ICD-10-CM | POA: Insufficient documentation

## 2020-08-13 DIAGNOSIS — M8580 Other specified disorders of bone density and structure, unspecified site: Secondary | ICD-10-CM | POA: Insufficient documentation

## 2020-08-13 DIAGNOSIS — Z8639 Personal history of other endocrine, nutritional and metabolic disease: Secondary | ICD-10-CM | POA: Insufficient documentation

## 2020-08-14 ENCOUNTER — Other Ambulatory Visit: Payer: Self-pay

## 2020-08-14 MED ORDER — CLONAZEPAM 1 MG PO TABS: ORAL_TABLET | ORAL | 3 refills | Status: DC

## 2020-08-14 NOTE — Telephone Encounter (Signed)
Good morning Dr. Marcelline Mates, Please advise. Thanks [employer]

## 2020-10-21 ENCOUNTER — Emergency Department (HOSPITAL_COMMUNITY): Payer: 59

## 2020-10-21 ENCOUNTER — Emergency Department (HOSPITAL_COMMUNITY)
Admission: EM | Admit: 2020-10-21 | Discharge: 2020-10-21 | Disposition: A | Discharge status: Home / Self Care | Payer: 59 | Attending: Emergency Medicine | Admitting: Emergency Medicine

## 2020-10-21 ENCOUNTER — Other Ambulatory Visit: Payer: Self-pay

## 2020-10-21 ENCOUNTER — Encounter (HOSPITAL_COMMUNITY): Payer: Self-pay | Admitting: Emergency Medicine

## 2020-10-21 DIAGNOSIS — R079 Chest pain, unspecified: Secondary | ICD-10-CM

## 2020-10-21 DIAGNOSIS — S138XXA Sprain of joints and ligaments of other parts of neck, initial encounter: Secondary | ICD-10-CM | POA: Diagnosis not present

## 2020-10-21 DIAGNOSIS — W01190A Fall on same level from slipping, tripping and stumbling with subsequent striking against furniture, initial encounter: Secondary | ICD-10-CM | POA: Insufficient documentation

## 2020-10-21 DIAGNOSIS — F17210 Nicotine dependence, cigarettes, uncomplicated: Secondary | ICD-10-CM | POA: Diagnosis not present

## 2020-10-21 DIAGNOSIS — Y92003 Bedroom of unspecified non-institutional (private) residence as the place of occurrence of the external cause: Secondary | ICD-10-CM | POA: Diagnosis not present

## 2020-10-21 DIAGNOSIS — S0003XA Contusion of scalp, initial encounter: Secondary | ICD-10-CM | POA: Diagnosis not present

## 2020-10-21 DIAGNOSIS — W19XXXA Unspecified fall, initial encounter: Secondary | ICD-10-CM

## 2020-10-21 DIAGNOSIS — S139XXA Sprain of joints and ligaments of unspecified parts of neck, initial encounter: Secondary | ICD-10-CM

## 2020-10-21 DIAGNOSIS — S20219A Contusion of unspecified front wall of thorax, initial encounter: Secondary | ICD-10-CM

## 2020-10-21 DIAGNOSIS — S20211A Contusion of right front wall of thorax, initial encounter: Secondary | ICD-10-CM | POA: Diagnosis not present

## 2020-10-21 DIAGNOSIS — S0990XA Unspecified injury of head, initial encounter: Secondary | ICD-10-CM | POA: Diagnosis present

## 2020-10-21 MED ORDER — ORPHENADRINE CITRATE ER 100 MG PO TB12: 100.0000 mg | ORAL_TABLET | Freq: Two times a day (BID) | ORAL | 0 refills | Status: DC

## 2020-10-21 MED ORDER — IBUPROFEN 600 MG PO TABS: 600.0000 mg | ORAL_TABLET | Freq: Four times a day (QID) | ORAL | 0 refills | Status: DC | PRN

## 2020-10-21 NOTE — Discharge Instructions (Signed)
Home to rest today. Take Motrin and Tylenol as needed as directed/prescribed. Warm compresses to sore muscles followed by gentle stretching.  Return to Er for worsening or concerning symptoms, otherwise recheck with your doctor next week.

## 2020-10-21 NOTE — ED Triage Notes (Addendum)
Pt states she fell in her bedroom and hit her head on the baseboard. Pt states she is also having rib pain.  Pt denies being on blood thinners.

## 2020-10-21 NOTE — ED Provider Notes (Signed)
[facility] EMERGENCY DEPARTMENT Provider Note   CSN: [id number] Arrival date & time: 10/21/20  1205     History Chief Complaint  Patient presents with  . Fall    Colleen Mcneil is a 64 y.o. female.  64 year old female presents for evaluation after a fall. Patient states she was getting out of bed at 8am today and accidentally tipped over her bedside table. Patient was picking up the table when she tripped and fell, striking the back of her head/top of her neck on a sharp edge of baseboard.  Patient reports pain in the back of her head as well as her neck.  Denies loss of consciousness, is not anticoagulated.  States that she is now is sore in her left and right ribs.  Denies visual disturbance, nausea, vomiting, any other complaints or concerns.  Patient has been ambulatory since the fall without difficulty.        Past Medical History:  Diagnosis Date  . Anxiety   . Depression   . Elevated cholesterol   . Genital warts 1976   pt was treated with dyphylin  . Menopause syndrome   . Vitamin D deficiency   . Vulvar lesion     Patient Active Problem List   Diagnosis Date Noted  . Osteopenia determined by x-ray 08/13/2020  . Atypical nevi 08/13/2020  . Dyslipidemia 08/13/2020  . History of vitamin D deficiency 08/13/2020  . Benign neoplasm of sigmoid colon 09/06/2019  . Anxiety, mild 09/06/2019  . Bereavement 09/06/2019  . 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 Pre-diabetes 07/04/2018  . Special screening for malignant neoplasms, colon     Past Surgical History:  Procedure Laterality Date  . CESAREAN SECTION  1986  . COLONOSCOPY WITH PROPOFOL N/A 05/20/2016   Procedure: COLONOSCOPY WITH PROPOFOL;  Surgeon: Lucilla Lame, MD;  Location: [facility];  Service: Endoscopy;  Laterality: N/A;  . HERNIA REPAIR    . POLYPECTOMY  05/20/2016   Procedure: POLYPECTOMY;  Surgeon: Lucilla Lame, MD;  Location: [facility];  Service: Endoscopy;;  . TUBAL LIGATION        OB History    Gravida  3   Para  3   Term      Preterm      AB      Living        SAB      IAB      Ectopic      Multiple      Live Births              Family History  Problem Relation Age of Onset  . Heart disease Mother   . Diabetes Mother   . Diabetes Father   . Liver disease Son   . Breast cancer Neg Hx     Social History   Tobacco Use  . Smoking status: Current Every Day Smoker    Packs/day: 0.50    Years: 25.00    Pack years: 12.50    Types: Cigarettes  . Smokeless tobacco: Never Used  Vaping Use  . Vaping Use: Never used  Substance Use Topics  . Alcohol use: No  . Drug use: No    Home Medications Prior to Admission medications   Medication Sig Start Date End Date Taking? Authorizing Provider  acetaminophen (TYLENOL) 325 MG tablet  10/09/19   [provider]  Cholecalciferol (VITAMIN D3) 5000 units CAPS Take 1 capsule (5,000 Units total) by mouth daily. 02/27/17  Shambley, Melody N, CNM  clonazePAM (KLONOPIN) 1 MG tablet TAKE 1 TABLET TWICE DAILY AS NEEDED FOR ANXIETY 08/14/20   Rubie Maid, MD  FLUoxetine (PROZAC) 10 MG capsule TAKE (2) CAPSULES BY MOUTH ONCE DAILY. 06/03/20   Rubie Maid, MD  ibuprofen (ADVIL) 600 MG tablet Take 1 tablet (600 mg total) by mouth every 6 (six) hours as needed for headache or moderate pain. 10/21/20   Tacy Learn, PA-C  orphenadrine (NORFLEX) 100 MG tablet Take 1 tablet (100 mg total) by mouth 2 (two) times daily. 10/21/20   Tacy Learn, PA-C    Allergies    Macrobid [nitrofurantoin monohyd macro], Nitrofurantoin, and Sulfamethoxazole-trimethoprim  Review of Systems   Review of Systems  Constitutional: Negative for fever.  Eyes: Negative for visual disturbance.  Respiratory: Negative for shortness of breath.   Cardiovascular: Negative for chest pain.  Gastrointestinal: Negative for nausea and vomiting.  Musculoskeletal: Positive for neck pain. Negative for arthralgias, back  pain, gait problem and joint swelling.  Skin: Negative for color change, rash and wound.  Allergic/Immunologic: Negative for immunocompromised state.  Neurological: Positive for headaches. Negative for dizziness, speech difficulty, weakness and numbness.  Hematological: Does not bruise/bleed easily.  Psychiatric/Behavioral: Negative for confusion.  All other systems reviewed and are negative.   Physical Exam Updated Vital Signs BP 123/82 (BP Location: Right Arm)   Pulse 72   Temp 97.6 F (36.4 C) (Oral)   Resp 18   Ht 5\' 6"  (1.676 m)   Wt 68 kg   SpO2 100%   BMI 24.21 kg/m   Physical Exam Vitals and nursing note reviewed.  Constitutional:      General: She is not in acute distress.    Appearance: She is well-developed and well-nourished. She is not diaphoretic.  HENT:     Head: Normocephalic and atraumatic.     Mouth/Throat:     Mouth: Mucous membranes are moist.  Eyes:     Extraocular Movements: Extraocular movements intact.     Pupils: Pupils are equal, round, and reactive to light.  Cardiovascular:     Rate and Rhythm: Normal rate and regular rhythm.     Pulses: Normal pulses.     Heart sounds: Normal heart sounds.  Pulmonary:     Effort: Pulmonary effort is normal.     Breath sounds: Normal breath sounds.  Chest:     Chest wall: Tenderness present.    Abdominal:     Palpations: Abdomen is soft.     Tenderness: There is no abdominal tenderness.  Musculoskeletal:        General: Tenderness present. No swelling or deformity.     Cervical back: Tenderness present.     Thoracic back: No tenderness or bony tenderness.     Lumbar back: No tenderness or bony tenderness.       Back:     Right lower leg: No edema.     Left lower leg: No edema.  Skin:    General: Skin is warm and dry.     Findings: No erythema or rash.  Neurological:     Mental Status: She is alert and oriented to person, place, and time.     Cranial Nerves: No cranial nerve deficit.   Psychiatric:        Mood and Affect: Mood and affect normal.        Behavior: Behavior normal.     ED Results / Procedures / Treatments   Labs (all labs ordered are listed,  but only abnormal results are displayed) Labs Reviewed - No data to display  EKG None  Radiology DG Ribs Bilateral W/Chest  Result Date: 10/21/2020 CLINICAL DATA:  Recent fall with chest pain, initial encounter EXAM: BILATERAL RIBS AND CHEST - 4+ VIEW COMPARISON:  None. FINDINGS: Cardiac shadow is within normal limits. Aortic calcifications are seen. The lungs are well aerated bilaterally. No focal infiltrate or sizable effusion is seen. No acute rib abnormality is noted. IMPRESSION: No acute abnormality seen. Electronically Signed   By: Inez Catalina M.D.   On: 10/21/2020 14:34   CT Head Wo Contrast  Result Date: 10/21/2020 CLINICAL DATA:  Head trauma.  Fall. EXAM: CT HEAD WITHOUT CONTRAST CT CERVICAL SPINE WITHOUT CONTRAST TECHNIQUE: Multidetector CT imaging of the head and cervical spine was performed following the standard protocol without intravenous contrast. Multiplanar CT image reconstructions of the cervical spine were also generated. COMPARISON:  None. FINDINGS: CT HEAD FINDINGS Brain: No evidence of acute infarction, hemorrhage, hydrocephalus, extra-axial collection or mass lesion/mass effect. Vascular: Calcific atherosclerosis. Skull: No acute fracture. Sinuses/Orbits: Visible sinuses are clear.  Unremarkable orbits. Other: No mastoid effusions. CT CERVICAL SPINE FINDINGS Alignment: Straightening of the normal cervical lordosis. No substantial sagittal subluxation. Skull base and vertebrae: No acute fracture. No primary bone lesion or focal pathologic process. Soft tissues and spinal canal: No prevertebral fluid or swelling. No visible canal hematoma. Disc levels: Moderate degenerative disc disease at C6-C7 where there is disc height loss and endplate spurring. Upper chest: Negative. IMPRESSION: 1. No evidence  of acute intracranial abnormality. 2. No evidence of acute fracture or traumatic malalignment the cervical spine. Electronically Signed   By: Margaretha Sheffield MD   On: 10/21/2020 14:45   CT Cervical Spine Wo Contrast  Result Date: 10/21/2020 CLINICAL DATA:  Head trauma.  Fall. EXAM: CT HEAD WITHOUT CONTRAST CT CERVICAL SPINE WITHOUT CONTRAST TECHNIQUE: Multidetector CT imaging of the head and cervical spine was performed following the standard protocol without intravenous contrast. Multiplanar CT image reconstructions of the cervical spine were also generated. COMPARISON:  None. FINDINGS: CT HEAD FINDINGS Brain: No evidence of acute infarction, hemorrhage, hydrocephalus, extra-axial collection or mass lesion/mass effect. Vascular: Calcific atherosclerosis. Skull: No acute fracture. Sinuses/Orbits: Visible sinuses are clear.  Unremarkable orbits. Other: No mastoid effusions. CT CERVICAL SPINE FINDINGS Alignment: Straightening of the normal cervical lordosis. No substantial sagittal subluxation. Skull base and vertebrae: No acute fracture. No primary bone lesion or focal pathologic process. Soft tissues and spinal canal: No prevertebral fluid or swelling. No visible canal hematoma. Disc levels: Moderate degenerative disc disease at C6-C7 where there is disc height loss and endplate spurring. Upper chest: Negative. IMPRESSION: 1. No evidence of acute intracranial abnormality. 2. No evidence of acute fracture or traumatic malalignment the cervical spine. Electronically Signed   By: Margaretha Sheffield MD   On: 10/21/2020 14:45    Procedures Procedures (including critical care time)  Medications Ordered in ED Medications - No data to display  ED Course  I have reviewed the triage vital signs and the nursing notes.  Pertinent labs & imaging results that were available during my care of the patient were reviewed by me and considered in my medical decision making (see chart for details).  Clinical Course  as of 10/21/20 1524  Wed Oct 21, 5073  4424 64 year old female presents for evaluation after fall as above.  On exam found to have right trapezius pain as well as occipital tenderness without crepitus or open wounds.  Also found  to have left and right lower chest wall pain, abdomen is soft and nontender. CT head and C-spine as well as x-ray of the ribs are negative for acute injury.  Discussed results with patient, recommend warm compresses and gentle stretching, discussed pain management with patient who requests ibuprofen 600 mg, will also give prescription for Norflex.  Recommend recheck with PCP next week, discussed potentially needing physical therapy if not improving advised return to ER for any worsening or concerning symptoms. [LM]    Clinical Course User Index [LM] Roque Lias   MDM Rules/Calculators/A&P                          Final Clinical Impression(s) / ED Diagnoses Final diagnoses:  Fall, initial encounter  Contusion of scalp, initial encounter  Neck sprain, initial encounter  Contusion of chest wall, unspecified laterality, initial encounter    Rx / DC Orders ED Discharge Orders         Ordered    ibuprofen (ADVIL) 600 MG tablet  Every 6 hours PRN        10/21/20 1517    orphenadrine (NORFLEX) 100 MG tablet  2 times daily        10/21/20 1522           Tacy Learn, PA-C 10/21/20 1524    Elnora Morrison, MD 10/24/20 [phone]

## 2020-11-25 ENCOUNTER — Inpatient Hospital Stay: Payer: 59 | Attending: Obstetrics and Gynecology | Admitting: Obstetrics and Gynecology

## 2020-11-25 ENCOUNTER — Other Ambulatory Visit: Payer: Self-pay

## 2020-11-25 VITALS — BP 123/83 | HR 70 | Temp 97.8°F | Resp 20 | Wt 142.6 lb

## 2020-11-25 DIAGNOSIS — E559 Vitamin D deficiency, unspecified: Secondary | ICD-10-CM | POA: Diagnosis not present

## 2020-11-25 DIAGNOSIS — E785 Hyperlipidemia, unspecified: Secondary | ICD-10-CM | POA: Diagnosis not present

## 2020-11-25 DIAGNOSIS — N9089 Other specified noninflammatory disorders of vulva and perineum: Secondary | ICD-10-CM | POA: Diagnosis present

## 2020-11-25 DIAGNOSIS — Z79899 Other long term (current) drug therapy: Secondary | ICD-10-CM | POA: Insufficient documentation

## 2020-11-25 DIAGNOSIS — R7303 Prediabetes: Secondary | ICD-10-CM | POA: Insufficient documentation

## 2020-11-25 DIAGNOSIS — N903 Dysplasia of vulva, unspecified: Secondary | ICD-10-CM | POA: Diagnosis not present

## 2020-11-25 DIAGNOSIS — F419 Anxiety disorder, unspecified: Secondary | ICD-10-CM | POA: Diagnosis not present

## 2020-11-25 DIAGNOSIS — M8580 Other specified disorders of bone density and structure, unspecified site: Secondary | ICD-10-CM | POA: Diagnosis not present

## 2020-11-25 DIAGNOSIS — Z9079 Acquired absence of other genital organ(s): Secondary | ICD-10-CM | POA: Insufficient documentation

## 2020-11-25 DIAGNOSIS — Z634 Disappearance and death of family member: Secondary | ICD-10-CM | POA: Diagnosis not present

## 2020-11-25 DIAGNOSIS — F32A Depression, unspecified: Secondary | ICD-10-CM | POA: Diagnosis not present

## 2020-11-25 DIAGNOSIS — F17210 Nicotine dependence, cigarettes, uncomplicated: Secondary | ICD-10-CM | POA: Insufficient documentation

## 2020-11-25 NOTE — Progress Notes (Signed)
Gynecologic Oncology Interval Visit   Referring Provider: Lorelle Gibbs, CNM  Chief Complaint: Vulvar lesion with dysplasia  Subjective:  Colleen Mcneil is a 65 y.o. 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   Saw Dr. Marcelline Mates for well woman exam in October 2021 with negative exam. Today she returns to clinic for follow up. She feels well and denies concerning lesions.  I reviewed her prior op note. At the time of surgery she had a -9 x 4 cm soft tissue defect down to the level of subcutaneous tissue (depth of 4 cm). The photo in the [facility] media tab demonstrated the initial lesion as well as a erythematous 2 cm lesion inferior to the vulvar condylomatous lesion.    Gynecologic history: Patient initially presented for left perineal/gluteal lesion that has been enlarging over 2 years.  Measured 5 cm x 4-1/2 cm x 3.4 cm with irregular borders, pleuritic but not painful.  Biopsy was performed on 08/15/2019.  History of genital warts at age 46 treated with podophyllin.  She has had persistent lesion there for many years. Not painful, but not sexually active for a few years. Husband has severe arthritis.  Son died earlier this year due to drug addiction.  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   A. SKIN, LEFT PERINEUM BOTTOM, BIOPSY:  - Moderate squamous dysplasia, focal.   B. SKIN, LEFT PERINEUM TOP, BIOPSY:  - Moderate squamous dysplasia, focal.  Last hgA1c was 6.1. TSH 2.360. Last Pap was 02/27/2017 - NILM, HR HPV Negative. She is a current smoker 1/2 PPD.   On 10/08/2019 she underwent EUA and simple vulvectomy.  Dr. Owens Shark with plastic surgery assisted. 2 weeks postop sutures were removed with plastic surgery.  She had a superficial separation of her incision.  Pathology: A. Left vulva, partial vulvectomy: Condyloma (low grade squamous intraepithelial  lesion). The lateral and deep margins appear negative for intraepithelial lesion. The lesion is located 0.3 cm from the 12 o'clock, 0.4 cm from the 6 o'clock, <0.1 cm from the right lateral (12 to 3 to 6 o'clock), and <0.1 cm from the left lateral (6 to 9 to 12 o'clock) margins.   B. Posterior margin: Benign squamous mucosa, negative for intraepithelial lesion.  She has been NED since.    Pap smears have been negative 02/27/2017 NILM, HR HPV 06/03/20 NILM, HPV Negative   Cervical polyp biopsied off- benign  Problem List: Patient Active Problem List   Diagnosis Date Noted  . Osteopenia determined by x-ray 08/13/2020  . Atypical nevi 08/13/2020  . Dyslipidemia 08/13/2020  . History of vitamin D deficiency 08/13/2020  . Benign neoplasm of sigmoid colon 09/06/2019  . Anxiety, mild 09/06/2019  . Bereavement 09/06/2019  . 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 Pre-diabetes 07/04/2018  . Special screening for malignant neoplasms, colon     Past Medical History: Past Medical History:  Diagnosis Date  . Anxiety   . Depression   . Elevated cholesterol   . Genital warts 1976   pt was treated with dyphylin  . Menopause syndrome   . Vitamin D deficiency   . Vulvar lesion     Past Surgical History: Past Surgical History:  Procedure Laterality Date  . CESAREAN SECTION  1986  . COLONOSCOPY WITH PROPOFOL N/A 05/20/2016   Procedure: COLONOSCOPY WITH PROPOFOL;  Surgeon: Lucilla Lame, MD;  Location: [facility];  Service: Endoscopy;  Laterality: N/A;  . HERNIA REPAIR    . POLYPECTOMY  05/20/2016   Procedure: POLYPECTOMY;  Surgeon: Lucilla Lame, MD;  Location: [facility];  Service: Endoscopy;;  . TUBAL LIGATION      OB History:  OB History  Gravida Para Term Preterm AB Living  3 3          SAB IAB Ectopic Multiple Live Births               # Outcome Date GA Lbr Len/2nd Weight Sex Delivery Anes PTL Lv  3 Para 66    M CS-Unspec     2 Para 1982    F Vag-Spont      1 Para 1981    M Vag-Spont       Family History: Family History  Problem Relation Age of Onset  . Heart disease Mother   . Diabetes Mother   . Diabetes Father   . Liver disease Son   . Breast cancer Neg Hx     Social History: Social History   Socioeconomic History  . Marital status: Married    Spouse name: Not on file  . Number of children: Not on file  . Years of education: Not on file  . Highest education level: Not on file  Occupational History  . Not on file  Tobacco Use  . Smoking status: Current Every Day Smoker    Packs/day: 0.50    Years: 25.00    Pack years: 12.50    Types: Cigarettes  . Smokeless tobacco: Never Used  Vaping Use  . Vaping Use: Never used  Substance and Sexual Activity  . Alcohol use: No  . Drug use: No  . Sexual activity: Not Currently  Other Topics Concern  . Not on file  Social History Narrative  . Not on file   Social Determinants of Health   Financial Resource Strain: Not on file  Food Insecurity: Not on file  Transportation Needs: Not on file  Physical Activity: Not on file  Stress: Not on file  Social Connections: Not on file  Intimate Partner Violence: Not on file    Allergies: Allergies  Allergen Reactions  . Macrobid [Nitrofurantoin Monohyd Macro] Nausea And Vomiting  . Nitrofurantoin Nausea And Vomiting  . Sulfamethoxazole-Trimethoprim     Current Medications: Current Outpatient Medications  Medication Sig Dispense Refill  . acetaminophen (TYLENOL) 325 MG tablet     . Cholecalciferol (VITAMIN D3) 5000 units CAPS Take 1 capsule (5,000 Units total) by mouth daily. 120 capsule 2  . clonazePAM (KLONOPIN) 1 MG tablet TAKE 1 TABLET TWICE DAILY AS NEEDED FOR ANXIETY 90 tablet 3  . FLUoxetine (PROZAC) 10 MG capsule TAKE (2) CAPSULES BY MOUTH ONCE DAILY. 90 capsule 3  . ibuprofen (ADVIL) 600 MG tablet Take 1 tablet (600 mg total) by mouth every 6 (six) hours as needed for headache or moderate pain. 20 tablet 0  .  orphenadrine (NORFLEX) 100 MG tablet Take 1 tablet (100 mg total) by mouth 2 (two) times daily. 12 tablet 0   No current facility-administered medications for this visit.   Review of Systems General:  no complaints Skin: no complaints Eyes: no complaints HEENT: no complaints Breasts: no complaints Pulmonary: no complaints Cardiac: no complaints Gastrointestinal: no complaints Genitourinary/Sexual: no complaints Ob/Gyn: no complaints Musculoskeletal: no complaints Hematology: no complaints Neurologic/Psych: no complaints   Objective:  Physical Examination:  BP 123/83   Pulse 70   Temp 97.8 F (36.6  C)   Resp 20   Wt 142 lb 9.6 oz (64.7 kg)   SpO2 100%   BMI 23.02 kg/m     ECOG Performance Status: 0 - Asymptomatic  GENERAL: Patient is a well appearing female in no acute distress HEENT:  Sclera clear. Anicteric NODES:  Negative axillary, supraclavicular, inguinal lymph node survery EXTREMITIES:  No peripheral edema. Atraumatic.  SKIN:  Clear with no obvious rashes or skin changes.  NEURO:  Nonfocal. Well oriented.  Appropriate affect.  Pelvic: Chaperoned by nursing EGBUS: Perineum and perianal area well healed. The 1 cm left inferior perianal lesion is present and stable compared to prior presurgical photo  Vagina: no lesions, no discharge. Cervix: No lesions Uterus: small and mobile Adnexa: no masses or nodules RV: deferred   Assessment:  Colleen Mcneil is a 65 y.o. female diagnosed with vulvar/perianal dysplasia/warts.  It was a large broad raised area that was somewhat concerning for early invasive cancer.  Remote history of genital warts treated with podophyllin. In view of this we performed WLE 10/08/19 and Plastics assisted in closure, but no flap was needed.  The final path showed condyloma/dysplasia with no cancer. Margins were negative.  NED today.   Small endocervical polyp s/p biopsy. Cervix normal on exam.   Left perianal lesion stable.      Medical  co-morbidities complicating care: smoker.  Plan:   Problem List Items Addressed This Visit   None   Visit Diagnoses    Vulvar dysplasia    -  Primary      Normal vulvar exam and will RTC in 6 months. She will continue to see Dr. Marcelline Mates. At her visit 10/2021 she can be released if there is no evidence of recurrence.   Beckey Rutter, DNP, AGNP-C [place] at [facility] [phone] (clinic)  I personally had a face to face interaction and evaluated the patient jointly with the NP, Ms. Beckey Rutter.  I have reviewed her history and available records and have performed the key portions of the physical exam including lymph node survey, abdominal exam, pelvic exam with my findings confirming those documented above by the APP.  I have discussed the case with the APP and the patient.  I agree with the above documentation, assessment and plan which was fully formulated by me.  Counseling was completed by me.   I personally saw the patient and performed a substantive portion of this encounter in conjunction with the listed APP as documented above.  Elner Seifert Gaetana Michaelis, MD

## 2020-12-02 ENCOUNTER — Ambulatory Visit: Payer: 59

## 2021-02-05 ENCOUNTER — Other Ambulatory Visit: Payer: Self-pay

## 2021-02-05 MED ORDER — FLUOXETINE HCL 10 MG PO CAPS: ORAL_CAPSULE | ORAL | 6 refills | Status: DC

## 2021-05-25 NOTE — Progress Notes (Signed)
Gynecologic Oncology Interval Visit   Referring Provider: Lorelle Gibbs, CNM  Chief Complaint: Vulvar lesion with dysplasia  Subjective:  Colleen Mcneil is a 65 y.o. 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 She was last seen in clinic on 11/25/2020.   Saw Dr. Marcelline Mates for well woman exam in October 2021 with negative exam.  No new lesions of symptoms reported today.  Still smokes 1/2 PPD and has been decreasing her Clonopin and Prozac.     Gynecologic history: Patient initially presented for left perineal/gluteal lesion that has been enlarging over 2 years.  Measured 5 cm x 4-1/2 cm x 3.4 cm with irregular borders, pleuritic but not painful.  Biopsy was performed on 08/15/2019.  History of genital warts at age 16 treated with podophyllin.  She has had persistent lesion there for many years. Not painful, but not sexually active for a few years. Husband has severe arthritis.  Son died in 02-01-19 due to drug addiction.  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   A. SKIN, LEFT PERINEUM BOTTOM, BIOPSY:  - Moderate squamous dysplasia, focal.   B. SKIN, LEFT PERINEUM TOP, BIOPSY:  - Moderate squamous dysplasia, focal.  Last hgA1c was 6.1. TSH 2.360. Last Pap was 02/27/2017 - NILM, HR HPV Negative. She is a current smoker 1/2 PPD.   On 10/08/2019 she underwent EUA and simple vulvectomy.  Dr. Owens Shark with plastic surgery assisted. 2 weeks postop sutures were removed with plastic surgery.  She had a superficial separation of her incision.  Pathology: A. Left vulva, partial vulvectomy: Condyloma (low grade squamous intraepithelial lesion). The lateral and deep margins appear negative for intraepithelial lesion. The lesion is located 0.3 cm from the 12 o'clock, 0.4 cm from the 6 o'clock, <0.1 cm from the right lateral (12 to 3 to 6 o'clock), and <0.1 cm from the left  lateral (6 to 9 to 12 o'clock) margins.   B. Posterior margin: Benign squamous mucosa, negative for intraepithelial lesion.  She has been NED since.    Pap smears have been negative 02/27/2017 NILM, HR HPV 06/03/20 NILM, HPV Negative   Cervical polyp biopsied off- benign  Problem List: Patient Active Problem List   Diagnosis Date Noted   Osteopenia determined by x-ray 08/13/2020   Atypical nevi 08/13/2020   Dyslipidemia 08/13/2020   History of vitamin D deficiency 08/13/2020   Benign neoplasm of sigmoid colon 09/06/2019   Anxiety, mild 09/06/2019   Bereavement 09/06/2019   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   Pre-diabetes 07/04/2018   Special screening for malignant neoplasms, colon     Past Medical History: Past Medical History:  Diagnosis Date   Anxiety    Depression    Elevated cholesterol    Genital warts 1976   pt was treated with dyphylin   Menopause syndrome    Vitamin D deficiency    Vulvar lesion     Past Surgical History: Past Surgical History:  Procedure Laterality Date   CESAREAN SECTION  1986   COLONOSCOPY WITH PROPOFOL N/A 05/20/2016   Procedure: COLONOSCOPY WITH PROPOFOL;  Surgeon: Lucilla Lame, MD;  Location: [facility];  Service: Endoscopy;  Laterality: N/A;   HERNIA REPAIR     POLYPECTOMY  05/20/2016   Procedure: POLYPECTOMY;  Surgeon: Lucilla Lame, MD;  Location: [facility];  Service: Endoscopy;;   TUBAL LIGATION      OB History:  OB History  Gravida Para Term Preterm AB Living  3 3          SAB IAB Ectopic Multiple Live Births               # Outcome Date GA Lbr Len/2nd Weight Sex Delivery Anes PTL Lv  3 Para 1986    M CS-Unspec     2 Para 1982    F Vag-Spont     1 Para 1981    M Vag-Spont       Family History: Family History  Problem Relation Age of Onset   Heart disease Mother    Diabetes Mother    Diabetes Father    Liver disease Son    Breast cancer Neg Hx     Social History: Social History    Socioeconomic History   Marital status: Married    Spouse name: Not on file   Number of children: Not on file   Years of education: Not on file   Highest education level: Not on file  Occupational History   Not on file  Tobacco Use   Smoking status: Every Day    Packs/day: 0.50    Years: 25.00    Pack years: 12.50    Types: Cigarettes   Smokeless tobacco: Never  Vaping Use   Vaping Use: Never used  Substance and Sexual Activity   Alcohol use: No   Drug use: No   Sexual activity: Not Currently  Other Topics Concern   Not on file  Social History Narrative   Not on file   Social Determinants of Health   Financial Resource Strain: Not on file  Food Insecurity: Not on file  Transportation Needs: Not on file  Physical Activity: Not on file  Stress: Not on file  Social Connections: Not on file  Intimate Partner Violence: Not on file    Allergies: Allergies  Allergen Reactions   Macrobid [Nitrofurantoin Monohyd Macro] Nausea And Vomiting   Nitrofurantoin Nausea And Vomiting   Sulfamethoxazole-Trimethoprim     Current Medications: Current Outpatient Medications  Medication Sig Dispense Refill   acetaminophen (TYLENOL) 325 MG tablet      Cholecalciferol (VITAMIN D3) 5000 units CAPS Take 1 capsule (5,000 Units total) by mouth daily. 120 capsule 2   clonazePAM (KLONOPIN) 1 MG tablet TAKE 1 TABLET TWICE DAILY AS NEEDED FOR ANXIETY 90 tablet 3   FLUoxetine (PROZAC) 10 MG capsule TAKE (2) CAPSULES BY MOUTH ONCE DAILY. 60 capsule 6   ibuprofen (ADVIL) 600 MG tablet Take 1 tablet (600 mg total) by mouth every 6 (six) hours as needed for headache or moderate pain. (Patient not taking: Reported on 11/25/2020) 20 tablet 0   orphenadrine (NORFLEX) 100 MG tablet Take 1 tablet (100 mg total) by mouth 2 (two) times daily. (Patient not taking: Reported on 11/25/2020) 12 tablet 0   No current facility-administered medications for this visit.   Review of Systems General:  no  complaints Skin: no complaints Eyes: no complaints HEENT: no complaints Breasts: no complaints Pulmonary: no complaints Cardiac: no complaints Gastrointestinal: no complaints Genitourinary/Sexual: no complaints Musculoskeletal: no complaints Hematology: no complaints Neurologic/Psych: no complaints   Objective:  Physical Examination:  BP (!) 102/55   Pulse 70   Temp 97.8 F (36.6 C)   Resp 20   Wt 139 lb 8 oz (63.3 kg)   SpO2 100%   BMI 22.52 kg/m     ECOG Performance Status: 0 - Asymptomatic  GENERAL: Patient is a  well appearing female in no acute distress HEENT:  Sclera clear. Anicteric NODES:  Negative axillary, supraclavicular, inguinal lymph node survery EXTREMITIES:  No peripheral edema. Atraumatic.  SKIN:  Clear with no obvious rashes or skin changes.  NEURO:  Nonfocal. Well oriented.  Appropriate affect.  Pelvic: Chaperoned by nursing EGBUS: Perineum and perianal area well healed, no lesions. Atrophic  Vagina: no lesions, no discharge. Cervix: No lesions Uterus: small and mobile Adnexa: no masses or nodules RV: confirms  Assessment:  Colleen Mcneil is a 65 y.o. female diagnosed with vulvar/perianal dysplasia/warts.  It was a large broad raised area that was somewhat concerning for early invasive cancer.  Remote history of genital warts treated with podophyllin. In view of this we performed WLE 10/08/19 and Plastics assisted in closure, but no flap was needed.  The final path showed condyloma/dysplasia with no cancer. Margins were negative.  NED today.   Small endocervical polyp s/p biopsy. Cervix normal on exam.   Left perianal lesion stable.      Medical co-morbidities complicating care: smoker.  Plan:   Problem List Items Addressed This Visit       Genitourinary   Vulvar intraepithelial neoplasia (VIN) - Primary    Normal vulvar exam and will RTC in 12 months. She will see Dr. Marcelline Mates in 3 months and [place] in a year.  She can be released after that  to Dr Marcelline Mates if there is no evidence of recurrence.   I personally had a face to face interaction and evaluated the patient jointly with the NP student Benedetto Goad.  I have reviewed her history and available records and have performed the key portions of the physical exam including lymph node survey, abdominal exam, pelvic exam with my findings confirming those documented above by the student.   I agree with the above documentation, assessment and plan which was fully formulated by me.  Counseling was completed by me.  Mellody Drown, MD

## 2021-05-26 ENCOUNTER — Inpatient Hospital Stay: Payer: 59 | Attending: Obstetrics and Gynecology | Admitting: Obstetrics and Gynecology

## 2021-05-26 ENCOUNTER — Other Ambulatory Visit: Payer: Self-pay

## 2021-05-26 VITALS — BP 102/55 | HR 70 | Temp 97.8°F | Resp 20 | Wt 139.5 lb

## 2021-05-26 DIAGNOSIS — F32A Depression, unspecified: Secondary | ICD-10-CM | POA: Diagnosis not present

## 2021-05-26 DIAGNOSIS — Z9079 Acquired absence of other genital organ(s): Secondary | ICD-10-CM | POA: Diagnosis not present

## 2021-05-26 DIAGNOSIS — E7800 Pure hypercholesterolemia, unspecified: Secondary | ICD-10-CM | POA: Insufficient documentation

## 2021-05-26 DIAGNOSIS — M8580 Other specified disorders of bone density and structure, unspecified site: Secondary | ICD-10-CM | POA: Insufficient documentation

## 2021-05-26 DIAGNOSIS — N903 Dysplasia of vulva, unspecified: Secondary | ICD-10-CM | POA: Diagnosis present

## 2021-05-26 DIAGNOSIS — Z79899 Other long term (current) drug therapy: Secondary | ICD-10-CM | POA: Insufficient documentation

## 2021-05-26 DIAGNOSIS — F17210 Nicotine dependence, cigarettes, uncomplicated: Secondary | ICD-10-CM | POA: Insufficient documentation

## 2021-09-10 ENCOUNTER — Other Ambulatory Visit: Payer: Self-pay | Admitting: Obstetrics and Gynecology

## 2021-09-10 DIAGNOSIS — Z1231 Encounter for screening mammogram for malignant neoplasm of breast: Secondary | ICD-10-CM

## 2021-09-10 NOTE — Progress Notes (Signed)
ANNUAL PREVENTATIVE CARE GYNECOLOGY  ENCOUNTER NOTE  Subjective:       Colleen Mcneil is a 65 y.o. G3P3 female here for a routine annual gynecologic exam. 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 The patient is not sexually active. The patient has never taken hormone replacement therapy. Patient denies post-menopausal vaginal bleeding. The patient wears seatbelts: yes. The patient participates in regular exercise: reports working out in the yard.   Reports that she accidentally fractured her foot last week. Currently wearing a boot.  Notes that she has been cutting down on her dosage of Prozac and Klonopin (is only doing daily dosing instead of BID dosing). Feels like she has been doing well with managing her anxiety.     Gynecologic History No LMP recorded. Patient is postmenopausal. Contraception: post menopausal status Last Pap: 06/03/2020. Results were: normal Last mammogram: 11/20/2019. Results were: normal Last Colonoscopy: 05/20/2016. Results were normal.  Last Dexa Scan: 08/24/2018. Osteopenia.  T score is -1.8.    Obstetric History OB History  Gravida Para Term Preterm AB Living  3 3          SAB IAB Ectopic Multiple Live Births               # Outcome Date GA Lbr Len/2nd Weight Sex Delivery Anes PTL Lv  3 Para 15    M CS-Unspec     2 Para 1982    F Vag-Spont     1 Para 1981    M Vag-Spont       Past Medical History:  Diagnosis Date   Anxiety    Depression    Elevated cholesterol    Genital warts 1976   pt was treated with dyphylin   Menopause syndrome    Vitamin D deficiency    Vulvar lesion     Family History  Problem Relation Age of Onset   Heart disease Mother    Diabetes Mother    Diabetes Father    Liver disease Son    Breast cancer Neg Hx     Past Surgical History:  Procedure Laterality Date   CESAREAN SECTION  1986   COLONOSCOPY WITH PROPOFOL N/A 05/20/2016   Procedure: COLONOSCOPY WITH PROPOFOL;   Surgeon: Lucilla Lame, MD;  Location: [place];  Service: Endoscopy;  Laterality: N/A;   HERNIA REPAIR     POLYPECTOMY  05/20/2016   Procedure: POLYPECTOMY;  Surgeon: Lucilla Lame, MD;  Location: [place];  Service: Endoscopy;;   TUBAL LIGATION      Social History   Socioeconomic History   Marital status: Married    Spouse name: Not on file   Number of children: Not on file   Years of education: Not on file   Highest education level: Not on file  Occupational History   Not on file  Tobacco Use   Smoking status: Every Day    Packs/day: 0.50    Years: 25.00    Pack years: 12.50    Types: Cigarettes   Smokeless tobacco: Never  Vaping Use   Vaping Use: Never used  Substance and Sexual Activity   Alcohol use: No   Drug use: No   Sexual activity: Not Currently  Other Topics Concern   Not on file  Social History Narrative   Not on file   Social Determinants of Health   Financial Resource Strain: Not on file  Food Insecurity: Not on file  Transportation  Needs: Not on file  Physical Activity: Not on file  Stress: Not on file  Social Connections: Not on file  Intimate Partner Violence: Not on file    Current Outpatient Medications on File Prior to Visit  Medication Sig Dispense Refill   acetaminophen (TYLENOL) 325 MG tablet      Cholecalciferol (VITAMIN D3) 5000 units CAPS Take 1 capsule (5,000 Units total) by mouth daily. 120 capsule 2   clonazePAM (KLONOPIN) 1 MG tablet TAKE 1 TABLET TWICE DAILY AS NEEDED FOR ANXIETY 90 tablet 3   FLUoxetine (PROZAC) 10 MG capsule TAKE (2) CAPSULES BY MOUTH ONCE DAILY. 60 capsule 6   ibuprofen (ADVIL) 600 MG tablet Take 1 tablet (600 mg total) by mouth every 6 (six) hours as needed for headache or moderate pain. (Patient not taking: No sig reported) 20 tablet 0   orphenadrine (NORFLEX) 100 MG tablet Take 1 tablet (100 mg total) by mouth 2 (two) times daily. (Patient not taking: No sig reported) 12 tablet 0   No  current facility-administered medications on file prior to visit.    Allergies  Allergen Reactions   Macrobid [Nitrofurantoin Monohyd Macro] Nausea And Vomiting   Nitrofurantoin Nausea And Vomiting   Sulfamethoxazole-Trimethoprim      Review of Systems ROS Review of Systems - General ROS: negative for - chills, fatigue, fever, hot flashes, night sweats, weight gain or weight loss Psychological ROS: negative for - anxiety, decreased libido, depression, mood swings, physical abuse or sexual abuse Ophthalmic ROS: negative for - blurry vision, eye pain or loss of vision ENT ROS: negative for - headaches, hearing change, visual changes or vocal changes Allergy and Immunology ROS: negative for - hives, itchy/watery eyes or seasonal allergies Hematological and Lymphatic ROS: negative for - bleeding problems, bruising, swollen lymph nodes or weight loss Endocrine ROS: negative for - galactorrhea, hair pattern changes, hot flashes, malaise/lethargy, mood swings, palpitations, polydipsia/polyuria, skin changes, temperature intolerance or unexpected weight changes Breast ROS: negative for - new or changing breast lumps or nipple discharge Respiratory ROS: negative for - cough or shortness of breath Cardiovascular ROS: negative for - chest pain, irregular heartbeat, palpitations or shortness of breath Gastrointestinal ROS: no abdominal pain, change in bowel habits, or black or bloody stools Genito-Urinary ROS: no dysuria, trouble voiding, or hematuria.  Musculoskeletal ROS: negative for - joint pain or joint stiffness. Positive for intermittent right foot swelling since fracturing of foot last week.  Neurological ROS: negative for - bowel and bladder control changes Dermatological ROS: negative for rash and skin lesion changes   Objective:   BP 130/75 (BP Location: Left Arm, Patient Position: Sitting, Cuff Size: Normal)   Pulse 64   Ht 5\' 6"  (1.676 m)   Wt 141 lb 12.8 oz (64.3 kg)   BMI 22.89  kg/m   CONSTITUTIONAL: Well-developed, well-nourished female in no acute distress.  PSYCHIATRIC: Normal mood and affect. Normal behavior. Normal judgment and thought content. [place]: Alert and oriented to person, place, and time. Normal muscle tone coordination. No cranial nerve deficit noted. HENT:  Normocephalic, atraumatic, External right and left ear normal. Oropharynx is clear and moist EYES: Conjunctivae and EOM are normal. Pupils are equal, round, and reactive to light. No scleral icterus.  NECK: Normal range of motion, supple, no masses.  Normal thyroid.  SKIN: Skin is warm and dry. No rash noted. Not diaphoretic. No erythema. No pallor.  Multiple atypical nevi on trunk.  CARDIOVASCULAR: Normal heart rate noted, regular rhythm, no murmur. RESPIRATORY: Clear to  auscultation bilaterally. Effort and breath sounds normal, no problems with respiration noted. BREASTS: Symmetric in size. No masses, skin changes, nipple drainage, or lymphadenopathy. ABDOMEN: Soft, normal bowel sounds, no distention noted.  No tenderness, rebound or guarding.  BLADDER: Normal PELVIC:  Bladder no bladder distension noted  Urethra: normal appearing urethra with no masses, tenderness or lesions  Vulva: normal appearing vulva with no masses, tenderness or lesions  Vagina: normal appearing vagina with normal color and discharge, no lesions. Grade 1 cystocele.   Cervix: normal appearing cervix without discharge or lesions  Uterus: uterus is normal size, shape, consistency and nontender  Adnexa: normal adnexa in size, nontender and no masses  RV: External Exam NormaI, No Rectal Masses and Normal Sphincter tone  MUSCULOSKELETAL: Normal range of motion. No tenderness.  No cyanosis, clubbing, or edema.  2+ distal pulses. LYMPHATIC: No Axillary, Supraclavicular, or Inguinal Adenopathy.    GAD 7 : Generalized Anxiety Score 09/13/2021 12/06/2019  Nervous, Anxious, on Edge 2 1  Control/stop worrying 2 0  Worry too  much - different things 2 1  Trouble relaxing 1 0  Restless 1 0  Easily annoyed or irritable 1 1  Afraid - awful might happen 1 0  Total GAD 7 Score 10 3  Anxiety Difficulty Somewhat difficult Not difficult at all    Depression screen [facility] 2/9 09/13/2021 12/06/2019 06/22/2017  Decreased Interest 2 0 3  Down, Depressed, Hopeless 1 0 3  PHQ - 2 Score 3 0 6  Altered sleeping 0 0 1  Tired, decreased energy 1 0 2  Change in appetite 0 0 3  Feeling bad or failure about yourself  0 0 0  Trouble concentrating 0 0 1  Moving slowly or fidgety/restless 1 0 0  Suicidal thoughts 0 0 0  PHQ-9 Score 5 0 13  Difficult doing work/chores Somewhat difficult Not difficult at all Very difficult     Labs: Lab Results  Component Value Date   WBC 6.6 08/11/2020   HGB 11.9 08/11/2020   HCT 34.3 08/11/2020   MCV 89 08/11/2020   PLT 263 08/11/2020    Lab Results  Component Value Date   CREATININE 0.66 08/11/2020   BUN 11 08/11/2020   NA 142 08/11/2020   K 4.2 08/11/2020   CL 105 08/11/2020   CO2 25 08/11/2020    Lab Results  Component Value Date   ALT 15 08/11/2020   AST 15 08/11/2020   ALKPHOS 60 08/11/2020   BILITOT <0.2 08/11/2020    Lab Results  Component Value Date   CHOL 210 (H) 08/11/2020   HDL 43 08/11/2020   LDLCALC 143 (H) 08/11/2020   TRIG 132 08/11/2020   CHOLHDL 4.9 (H) 08/11/2020    Lab Results  Component Value Date   TSH 2.360 08/09/2019    Lab Results  Component Value Date   HGBA1C 5.8 (H) 08/11/2020     Assessment:   1. Encounter for well woman exam with routine gynecological exam   2. History of vitamin D deficiency   3. Prediabetes   4. Dyslipidemia   5. Anxiety, mild   6. Breast cancer screening by mammogram     Plan:  - Pap: Not done. Up to date. No longer needed as patient is at age 56 - Mammogram: ordered. Continue routine yearly screens.  - Stool Guaiac Testing:  Not Indicated. Last colonoscopy 05/20/2016.  - Labs: CBC, CMP, Lipid 1,  Hemoglobin A1C and Vit D Level. - Routine preventative health maintenance  measures emphasized: Exercise/Diet/Weight control, Tobacco Warnings, Alcohol/Substance use risks and Stress Management  - Continue Vitamin D supplementation, would also encourage calcium intake. Will be due for repeat Dexa scan within this next year.  - Anxiety managed with Klonopin and Prozac. Doing well per patient on decreased dosing (although GAD score is  slightly more elevated than last year). Will continue to monitor.  - Pre-diabetes and dyslipidemia mild, will f/u with labs. Continue to strongly encourage lifestyle modifications.  - Cystocele, patient notes doing Kegel exercises, urinary incontinence has decreased.  - Atypical nevi, encourage f/u with Dermatologist yearly for surveillance.   - COVID Vaccination status: has completed COVID vaccination series (Moderna) and booster.  - Flu vaccination status:08/19/2021.  - Needs to get pneumonia, shingles, and Tdap vaccine.  - Return to Clinic - 1 Year   Rubie Maid, MD [facility]

## 2021-09-13 ENCOUNTER — Encounter: Payer: Self-pay | Admitting: Obstetrics and Gynecology

## 2021-09-13 ENCOUNTER — Ambulatory Visit (INDEPENDENT_AMBULATORY_CARE_PROVIDER_SITE_OTHER): Payer: 59 | Admitting: Obstetrics and Gynecology

## 2021-09-13 ENCOUNTER — Other Ambulatory Visit: Payer: Self-pay

## 2021-09-13 VITALS — BP 130/75 | HR 64 | Ht 66.0 in | Wt 141.8 lb

## 2021-09-13 DIAGNOSIS — N903 Dysplasia of vulva, unspecified: Secondary | ICD-10-CM

## 2021-09-13 DIAGNOSIS — E785 Hyperlipidemia, unspecified: Secondary | ICD-10-CM | POA: Diagnosis not present

## 2021-09-13 DIAGNOSIS — R7303 Prediabetes: Secondary | ICD-10-CM | POA: Diagnosis not present

## 2021-09-13 DIAGNOSIS — F419 Anxiety disorder, unspecified: Secondary | ICD-10-CM

## 2021-09-13 DIAGNOSIS — M8580 Other specified disorders of bone density and structure, unspecified site: Secondary | ICD-10-CM

## 2021-09-13 DIAGNOSIS — Z8639 Personal history of other endocrine, nutritional and metabolic disease: Secondary | ICD-10-CM | POA: Diagnosis not present

## 2021-09-13 DIAGNOSIS — Z01419 Encounter for gynecological examination (general) (routine) without abnormal findings: Secondary | ICD-10-CM | POA: Diagnosis not present

## 2021-09-13 DIAGNOSIS — Z1231 Encounter for screening mammogram for malignant neoplasm of breast: Secondary | ICD-10-CM

## 2021-09-13 DIAGNOSIS — N8110 Cystocele, unspecified: Secondary | ICD-10-CM

## 2021-09-13 MED ORDER — FLUOXETINE HCL 10 MG PO CAPS: 10.0000 mg | ORAL_CAPSULE | Freq: Every day | ORAL | 3 refills | Status: DC

## 2021-09-13 MED ORDER — CLONAZEPAM 1 MG PO TABS: ORAL_TABLET | ORAL | 2 refills | Status: DC

## 2021-09-14 LAB — LIPID PANEL
Chol/HDL Ratio: 5.3 ratio — ABNORMAL HIGH (ref 0.0–4.4)
Cholesterol, Total: 227 mg/dL — ABNORMAL HIGH (ref 100–199)
HDL: 43 mg/dL (ref >39)
LDL Chol Calc (NIH): 160 mg/dL — ABNORMAL HIGH (ref 0–99)
Triglycerides: 134 mg/dL (ref 0–149)
VLDL Cholesterol Cal: 24 mg/dL (ref 5–40)

## 2021-09-14 LAB — COMPREHENSIVE METABOLIC PANEL
ALT: 11 IU/L (ref 0–32)
AST: 16 IU/L (ref 0–40)
Albumin/Globulin Ratio: 3 — ABNORMAL HIGH (ref 1.2–2.2)
Albumin: 4.8 g/dL (ref 3.8–4.8)
Alkaline Phosphatase: 65 IU/L (ref 44–121)
BUN/Creatinine Ratio: 19 (ref 12–28)
BUN: 11 mg/dL (ref 8–27)
Bilirubin Total: 0.3 mg/dL (ref 0.0–1.2)
CO2: 26 mmol/L (ref 20–29)
Calcium: 9.5 mg/dL (ref 8.7–10.3)
Chloride: 105 mmol/L (ref 96–106)
Creatinine, Ser: 0.58 mg/dL (ref 0.57–1.00)
Globulin, Total: 1.6 g/dL (ref 1.5–4.5)
Glucose: 96 mg/dL (ref 70–99)
Potassium: 4 mmol/L (ref 3.5–5.2)
Sodium: 142 mmol/L (ref 134–144)
Total Protein: 6.4 g/dL (ref 6.0–8.5)
eGFR: 100 mL/min/{1.73_m2} (ref >59)

## 2021-09-14 LAB — CBC
Hematocrit: 39.6 % (ref 34.0–46.6)
Hemoglobin: 13.2 g/dL (ref 11.1–15.9)
MCH: 29.8 pg (ref 26.6–33.0)
MCHC: 33.3 g/dL (ref 31.5–35.7)
MCV: 89 fL (ref 79–97)
Platelets: 280 10*3/uL (ref 150–450)
RBC: 4.43 x10E6/uL (ref 3.77–5.28)
RDW: 11.7 % (ref 11.7–15.4)
WBC: 5.4 10*3/uL (ref 3.4–10.8)

## 2021-09-14 LAB — HEMOGLOBIN A1C
Est. average glucose Bld gHb Est-mCnc: 123 mg/dL
Hgb A1c MFr Bld: 5.9 % — ABNORMAL HIGH (ref 4.8–5.6)

## 2021-09-14 LAB — VITAMIN D 25 HYDROXY (VIT D DEFICIENCY, FRACTURES): Vit D, 25-Hydroxy: 26.7 ng/mL — ABNORMAL LOW (ref 30.0–100.0)

## 2021-09-22 DIAGNOSIS — S8261XA Displaced fracture of lateral malleolus of right fibula, initial encounter for closed fracture: Secondary | ICD-10-CM | POA: Insufficient documentation

## 2021-09-22 DIAGNOSIS — S93401A Sprain of unspecified ligament of right ankle, initial encounter: Secondary | ICD-10-CM | POA: Insufficient documentation

## 2021-10-26 ENCOUNTER — Telehealth: Payer: Self-pay | Admitting: Obstetrics and Gynecology

## 2021-10-26 NOTE — Telephone Encounter (Signed)
Pt states that her husband suddenly passed away last February 10, 2023 and she had just seen Dr.Cherry and had reduced her Prozac from 20 mg- to 10 mg. Pt states that she is not doing well and requesting an increase in Prozac. Pharmacy confirmed as [facility]. Please Advise.

## 2021-10-27 NOTE — Telephone Encounter (Signed)
Please inform patient that we are very sorry for her loss. I will send in a new prescription with the increased dose.   Dr. Marcelline Mates

## 2021-11-17 ENCOUNTER — Other Ambulatory Visit: Payer: Self-pay

## 2021-11-17 ENCOUNTER — Ambulatory Visit
Admission: RE | Admit: 2021-11-17 | Discharge: 2021-11-17 | Disposition: A | Discharge status: Home / Self Care | Payer: 59 | Source: Ambulatory Visit | Attending: Obstetrics and Gynecology | Admitting: Obstetrics and Gynecology

## 2021-11-17 ENCOUNTER — Other Ambulatory Visit: Payer: Self-pay | Admitting: Obstetrics and Gynecology

## 2021-11-17 DIAGNOSIS — Z1231 Encounter for screening mammogram for malignant neoplasm of breast: Secondary | ICD-10-CM | POA: Insufficient documentation

## 2021-11-17 DIAGNOSIS — N6459 Other signs and symptoms in breast: Secondary | ICD-10-CM

## 2021-11-17 DIAGNOSIS — R922 Inconclusive mammogram: Secondary | ICD-10-CM

## 2021-11-17 DIAGNOSIS — N6001 Solitary cyst of right breast: Secondary | ICD-10-CM

## 2021-11-18 ENCOUNTER — Encounter: Payer: 59 | Admitting: Obstetrics and Gynecology

## 2021-12-01 ENCOUNTER — Other Ambulatory Visit: Payer: Self-pay

## 2021-12-01 ENCOUNTER — Ambulatory Visit
Admission: RE | Admit: 2021-12-01 | Discharge: 2021-12-01 | Disposition: A | Discharge status: Home / Self Care | Payer: 59 | Source: Ambulatory Visit | Attending: Obstetrics and Gynecology | Admitting: Obstetrics and Gynecology

## 2021-12-01 DIAGNOSIS — N6459 Other signs and symptoms in breast: Secondary | ICD-10-CM | POA: Insufficient documentation

## 2022-03-15 ENCOUNTER — Telehealth: Payer: Self-pay | Admitting: Obstetrics and Gynecology

## 2022-03-15 NOTE — Telephone Encounter (Signed)
Pt is requesting RX for Clonazepam and Fluoxetine she is out of med's at home. Pt states Pharmacy sent over multiple requests w/out a response. Pt states she lost her husband suddenly in Dec and really is struggling with/out med's Contact number - [phone] please call ?

## 2022-03-16 MED ORDER — CLONAZEPAM 1 MG PO TABS
ORAL_TABLET | ORAL | 2 refills | Status: DC
Start: 2022-03-16 — End: 2022-09-21

## 2022-03-16 NOTE — Telephone Encounter (Signed)
Please inform patient that she still has refills left on her Fluoxetine until November.  I can refill her Clonazepam.  Please apologize and let her know that I have not received any refill requests from her pharmacy.  ? ?Dr. Marcelline Mates ?

## 2022-03-17 ENCOUNTER — Telehealth: Payer: Self-pay | Admitting: Obstetrics and Gynecology

## 2022-03-17 MED ORDER — FLUOXETINE HCL 10 MG PO CAPS: 10.0000 mg | ORAL_CAPSULE | Freq: Every day | ORAL | 3 refills | Status: DC

## 2022-03-17 NOTE — Telephone Encounter (Signed)
Pt states "she called the Pharmacy they informed her they haven't received a RX for the Prozac, but stated "she could pick it up this afternoon if you have time to submit it.   ?

## 2022-03-17 NOTE — Telephone Encounter (Signed)
Well if that's the case, how has she been taking it all this time?

## 2022-05-25 ENCOUNTER — Inpatient Hospital Stay: Payer: 59 | Attending: Obstetrics and Gynecology | Admitting: Obstetrics and Gynecology

## 2022-05-25 ENCOUNTER — Encounter: Payer: Self-pay | Admitting: Obstetrics and Gynecology

## 2022-05-25 VITALS — BP 124/72 | HR 62 | Resp 18 | Ht 66.0 in | Wt 136.0 lb

## 2022-05-25 DIAGNOSIS — A630 Anogenital (venereal) warts: Secondary | ICD-10-CM | POA: Diagnosis not present

## 2022-05-25 DIAGNOSIS — Z9079 Acquired absence of other genital organ(s): Secondary | ICD-10-CM | POA: Insufficient documentation

## 2022-05-25 DIAGNOSIS — N903 Dysplasia of vulva, unspecified: Secondary | ICD-10-CM

## 2022-05-25 DIAGNOSIS — F17210 Nicotine dependence, cigarettes, uncomplicated: Secondary | ICD-10-CM | POA: Insufficient documentation

## 2022-05-25 DIAGNOSIS — Z87412 Personal history of vulvar dysplasia: Secondary | ICD-10-CM | POA: Diagnosis not present

## 2022-05-25 DIAGNOSIS — B0223 Postherpetic polyneuropathy: Secondary | ICD-10-CM | POA: Insufficient documentation

## 2022-05-25 NOTE — Progress Notes (Signed)
Gynecologic Oncology Interval Visit   Referring Provider: Lorelle Gibbs, CNM  Chief Complaint: Vulvar dysplasia  Subjective:  Colleen Mcneil is a 66 y.o. 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 She was last seen in clinic on 11/25/2020.   Returns for surveillance exam.  No new vulvar or other symptoms.  PAP/HPV negative 7/21.  Gynecologic history: Patient initially presented for left perineal/gluteal lesion that has been enlarging over 2 years.  Measured 5 cm x 4-1/2 cm x 3.4 cm with irregular borders, pleuritic but not painful.  Biopsy was performed on 08/15/2019.  History of genital warts at age 89 treated with podophyllin.  She has had persistent lesion there for many years. Not painful, but not sexually active for a few years. Husband has severe arthritis.  Son died in 02/02/19 due to drug addiction.  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   A. SKIN, LEFT PERINEUM BOTTOM, BIOPSY:  - Moderate squamous dysplasia, focal.   B. SKIN, LEFT PERINEUM TOP, BIOPSY:  - Moderate squamous dysplasia, focal.  Last hgA1c was 6.1. TSH 2.360. Last Pap was 02/27/2017 - NILM, HR HPV Negative. She is a current smoker 1/2 PPD.   On 10/08/2019 she underwent EUA and simple vulvectomy.  Dr. Owens Shark with plastic surgery assisted. 2 weeks postop sutures were removed with plastic surgery.  She had a superficial separation of her incision.  Pathology: A. Left vulva, partial vulvectomy: Condyloma (low grade squamous intraepithelial lesion). The lateral and deep margins appear negative for intraepithelial lesion. The lesion is located 0.3 cm from the 12 o'clock, 0.4 cm from the 6 o'clock, <0.1 cm from the right lateral (12 to 3 to 6 o'clock), and <0.1 cm from the left lateral (6 to 9 to 12 o'clock) margins.   B. Posterior margin: Benign squamous mucosa, negative for  intraepithelial lesion.  She has been NED since.    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   Osteopenia determined by x-ray 08/13/2020   Atypical nevi 08/13/2020   Dyslipidemia 08/13/2020   History of vitamin D deficiency 08/13/2020   Benign neoplasm of sigmoid colon 09/06/2019   Anxiety, mild 09/06/2019   Bereavement 09/06/2019   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   Pre-diabetes 07/04/2018   Special screening for malignant neoplasms, colon     Past Medical History: Past Medical History:  Diagnosis Date   Anxiety    Depression    Elevated cholesterol    Genital warts 1976   pt was treated with dyphylin   Menopause syndrome    Vitamin D deficiency    Vulvar lesion     Past Surgical History: Past Surgical History:  Procedure Laterality Date   CESAREAN SECTION  1986   COLONOSCOPY WITH PROPOFOL N/A 05/20/2016   Procedure: COLONOSCOPY WITH PROPOFOL;  Surgeon: Lucilla Lame, MD;  Location: [place];  Service: Endoscopy;  Laterality: N/A;   HERNIA REPAIR     POLYPECTOMY  05/20/2016   Procedure: POLYPECTOMY;  Surgeon: Lucilla Lame, MD;  Location: [place];  Service: Endoscopy;;   TUBAL LIGATION  OB History:  OB History  Gravida Para Term Preterm AB Living  '3 3 2 1   3  '$ SAB IAB Ectopic Multiple Live Births          3    # Outcome Date GA Lbr Len/2nd Weight Sex Delivery Anes PTL Lv  3 Preterm 48    M CS-Unspec   LIV  2 Term 1982    F Vag-Spont   LIV  1 Term 73    M Vag-Spont   LIV    Family History: Family History  Problem Relation Age of Onset   Heart disease Mother    Diabetes Mother     Diabetes Father    Liver disease Son    Breast cancer Neg Hx     Social History: Social History   Socioeconomic History   Marital status: Married    Spouse name: Not on file   Number of children: Not on file   Years of education: Not on file   Highest education level: Not on file  Occupational History   Not on file  Tobacco Use   Smoking status: Every Day    Packs/day: 0.50    Years: 25.00    Total pack years: 12.50    Types: Cigarettes   Smokeless tobacco: Never  Vaping Use   Vaping Use: Never used  Substance and Sexual Activity   Alcohol use: No   Drug use: No   Sexual activity: Not Currently  Other Topics Concern   Not on file  Social History Narrative   Not on file   Social Determinants of Health   Financial Resource Strain: Not on file  Food Insecurity: Not on file  Transportation Needs: Not on file  Physical Activity: Not on file  Stress: Not on file  Social Connections: Not on file  Intimate Partner Violence: Not on file    Allergies: Allergies  Allergen Reactions   Macrobid [Nitrofurantoin Monohyd Macro] Nausea And Vomiting   Nitrofurantoin Nausea And Vomiting   Sulfamethoxazole-Trimethoprim     Current Medications: Current Outpatient Medications  Medication Sig Dispense Refill   acetaminophen (TYLENOL) 325 MG tablet      clonazePAM (KLONOPIN) 1 MG tablet TAKE 1 TABLET DAILY AS NEEDED FOR ANXIETY 90 tablet 2   FLUoxetine (PROZAC) 10 MG capsule Take 1 capsule (10 mg total) by mouth daily. TAKE (1) CAPSULES BY MOUTH ONCE DAILY. 90 capsule 3   ibuprofen (ADVIL) 600 MG tablet Take 1 tablet (600 mg total) by mouth every 6 (six) hours as needed for headache or moderate pain. 20 tablet 0   Cholecalciferol (VITAMIN D3) 5000 units CAPS Take 1 capsule (5,000 Units total) by mouth daily. (Patient not taking: Reported on 05/25/2022) 120 capsule 2   No current facility-administered medications for this visit.   Review of Systems General:  no  complaints Skin: no complaints Eyes: no complaints HEENT: no complaints Breasts: no complaints Pulmonary: no complaints Cardiac: no complaints Gastrointestinal: no complaints Genitourinary/Sexual: no complaints Musculoskeletal: no complaints Hematology: no complaints Neurologic/Psych: no complaints   Objective:  Physical Examination:  BP 124/72 (BP Location: Left Arm, Patient Position: Sitting)   Pulse 62   Resp 18   Ht '5\' 6"'$  (1.676 m)   Wt 136 lb (61.7 kg)   BMI 21.95 kg/m     ECOG Performance Status: 0 - Asymptomatic  GENERAL: Patient is a well appearing female in no acute distress HEENT:  Sclera clear. Anicteric NODES:  Negative axillary, supraclavicular, inguinal lymph node  survery EXTREMITIES:  No peripheral edema. Atraumatic.  SKIN:  Clear with no obvious rashes or skin changes.  NEURO:  Nonfocal. Well oriented.  Appropriate affect.  Pelvic: Chaperoned by nursing EGBUS: Perineum and perianal area well healed, no lesions. Atrophic  Vagina: no lesions, no discharge. Cervix: No lesions Uterus: small and mobile Adnexa: no masses or nodules RV: confirms  Assessment:  PREEYA CLECKLEY is a 66 y.o. female diagnosed with vulvar/perianal dysplasia/warts.  It was a large broad raised area that was somewhat concerning for early invasive cancer.  Remote history of genital warts treated with podophyllin. In view of this we performed WLE 10/08/19 and Plastics assisted in closure, but no flap was needed.  The final path showed condyloma/dysplasia with no cancer. Margins were negative.  NED today.   Small endocervical polyp s/p biopsy. Cervix normal on exam.   Left perianal lesion stable.   PAP/HPV 7/21 negative     Medical co-morbidities complicating care: smoker.  Plan:   Problem List Items Addressed This Visit       Genitourinary   Vulvar intraepithelial neoplasia (VIN) - Primary   Normal vulvar exam today.  She will see Dr. Marcelline Mates annually and we can see her back  should any new concerning symptoms or lesions arise.  Mellody Drown, MD

## 2022-06-02 ENCOUNTER — Emergency Department: Payer: 59

## 2022-06-02 ENCOUNTER — Emergency Department
Admission: EM | Admit: 2022-06-02 | Discharge: 2022-06-02 | Disposition: A | Discharge status: Home / Self Care | Payer: 59 | Attending: Student in an Organized Health Care Education/Training Program | Admitting: Student in an Organized Health Care Education/Training Program

## 2022-06-02 ENCOUNTER — Other Ambulatory Visit: Payer: Self-pay

## 2022-06-02 DIAGNOSIS — R079 Chest pain, unspecified: Secondary | ICD-10-CM | POA: Diagnosis present

## 2022-06-02 DIAGNOSIS — R0789 Other chest pain: Secondary | ICD-10-CM | POA: Insufficient documentation

## 2022-06-02 DIAGNOSIS — I219 Acute myocardial infarction, unspecified: Secondary | ICD-10-CM

## 2022-06-02 HISTORY — DX: Acute myocardial infarction, unspecified: I21.9

## 2022-06-02 LAB — BASIC METABOLIC PANEL
Anion gap: 4 — ABNORMAL LOW (ref 5–15)
BUN: 15 mg/dL (ref 8–23)
CO2: 27 mmol/L (ref 22–32)
Calcium: 8.9 mg/dL (ref 8.9–10.3)
Chloride: 110 mmol/L (ref 98–111)
Creatinine, Ser: 0.6 mg/dL (ref 0.44–1.00)
GFR, Estimated: >60 mL/min (ref >60)
Glucose, Bld: 138 mg/dL — ABNORMAL HIGH (ref 70–99)
Potassium: 3.9 mmol/L (ref 3.5–5.1)
Sodium: 141 mmol/L (ref 135–145)

## 2022-06-02 LAB — D-DIMER, QUANTITATIVE: D-Dimer, Quant: 0.53 ug/mL-FEU — ABNORMAL HIGH (ref 0.00–0.50)

## 2022-06-02 LAB — CBC
HCT: 36.2 % (ref 36.0–46.0)
Hemoglobin: 12.1 g/dL (ref 12.0–15.0)
MCH: 30.6 pg (ref 26.0–34.0)
MCHC: 33.4 g/dL (ref 30.0–36.0)
MCV: 91.4 fL (ref 80.0–100.0)
Platelets: 252 10*3/uL (ref 150–400)
RBC: 3.96 MIL/uL (ref 3.87–5.11)
RDW: 12.4 % (ref 11.5–15.5)
WBC: 6.6 10*3/uL (ref 4.0–10.5)
nRBC: 0 % (ref 0.0–0.2)

## 2022-06-02 LAB — HEPATIC FUNCTION PANEL
ALT: 11 U/L (ref 0–44)
AST: 16 U/L (ref 15–41)
Albumin: 4 g/dL (ref 3.5–5.0)
Alkaline Phosphatase: 45 U/L (ref 38–126)
Bilirubin, Direct: <0.1 mg/dL (ref 0.0–0.2)
Total Bilirubin: 0.4 mg/dL (ref 0.3–1.2)
Total Protein: 6.5 g/dL (ref 6.5–8.1)

## 2022-06-02 LAB — TROPONIN I (HIGH SENSITIVITY)
Troponin I (High Sensitivity): 3 ng/L (ref <18)
Troponin I (High Sensitivity): 4 ng/L (ref <18)

## 2022-06-02 LAB — LIPASE, BLOOD: Lipase: 37 U/L (ref 11–51)

## 2022-06-02 MED ORDER — ONDANSETRON HCL 4 MG/2ML IJ SOLN
4.0000 mg | Freq: Once | INTRAMUSCULAR | Status: DC
  Filled 2022-06-02: qty 2

## 2022-06-02 MED ORDER — ALUM & MAG HYDROXIDE-SIMETH 200-200-20 MG/5ML PO SUSP
30.0000 mL | Freq: Once | ORAL | Status: AC
  Administered 2022-06-02: 30 mL via ORAL
  Filled 2022-06-02: qty 30

## 2022-06-02 MED ORDER — MORPHINE SULFATE (PF) 4 MG/ML IV SOLN: 4.0000 mg | INTRAVENOUS | Status: DC | PRN

## 2022-06-02 MED ORDER — IOHEXOL 350 MG/ML SOLN
75.0000 mL | Freq: Once | INTRAVENOUS | Status: AC | PRN
  Administered 2022-06-02: 75 mL via INTRAVENOUS

## 2022-06-02 NOTE — ED Provider Notes (Signed)
[facility] Provider Note    Event Date/Time   First MD Initiated Contact with Patient 06/02/22 2214     (approximate)   History   Chest Pain   HPI  RAKIYAH ESCH is a 66 y.o. female presents to the ER for evaluation of midsternal chest pain radiating to left shoulder started she was sitting down for dinner [place] in [employer] restaurant this evening with her family.  Denies any diaphoresis or clamminess no shortness of breath.  No nausea or vomiting.  States pain has been relatively constant.  No shooting or stabbing pain through to her back.  No worsening with position or movement.  Denies any history of heart or lung disease.  No lower extremity swelling or edema.  No recent prolonged travel.  No cough or congestion.  No fevers.     Physical Exam   Triage Vital Signs: ED Triage Vitals  Enc Vitals Group     BP 06/02/22 2005 (!) 147/81     Pulse Rate 06/02/22 2005 69     Resp 06/02/22 2005 17     Temp 06/02/22 2005 98.2 F (36.8 C)     Temp Source 06/02/22 2005 Oral     SpO2 06/02/22 2005 98 %     Weight 06/02/22 2002 136 lb 11 oz (62 kg)     Height 06/02/22 2002 '5\' 6"'$  (1.676 m)     Head Circumference --      Peak Flow --      Pain Score 06/02/22 2002 7     Pain Loc --      Pain Edu? --      Excl. in [place]? --     Most recent vital signs: Vitals:   06/02/22 2223 06/02/22 2336  BP: (!) 147/77 136/67  Pulse:  72  Resp: 17   Temp: 97.6 F (36.4 C)   SpO2: 97% 99%     Constitutional: Alert  Eyes: Conjunctivae are normal.  Head: Atraumatic. Nose: No congestion/rhinnorhea. Mouth/Throat: Mucous membranes are moist.   Neck: Painless ROM.  Cardiovascular:   Good peripheral circulation. No m/g/r Respiratory: Normal respiratory effort.  No retractions.  Gastrointestinal: Soft and nontender.  Musculoskeletal:  no deformity Neurologic:  MAE spontaneously. No gross focal neurologic deficits are appreciated.  Skin:  Skin is warm, dry and  intact. No rash noted. Psychiatric: Mood and affect are normal. Speech and behavior are normal.    ED Results / Procedures / Treatments   Labs (all labs ordered are listed, but only abnormal results are displayed) Labs Reviewed  BASIC METABOLIC PANEL - Abnormal; Notable for the following components:      Result Value   Glucose, Bld 138 (*)    Anion gap 4 (*)    All other components within normal limits  D-DIMER, QUANTITATIVE - Abnormal; Notable for the following components:   D-Dimer, Quant 0.53 (*)    All other components within normal limits  CBC  LIPASE, BLOOD  HEPATIC FUNCTION PANEL  TROPONIN I (HIGH SENSITIVITY)  TROPONIN I (HIGH SENSITIVITY)     EKG  ED ECG REPORT I, Merlyn Lot, the attending physician, personally viewed and interpreted this ECG.   Date: 06/02/2022  EKG Time: 20:01  Rate: 70  Rhythm: sinus  Axis: normal  Intervals: normal  ST&T Change: no stemi, no depressions    RADIOLOGY Please see ED Course for my review and interpretation.  I personally reviewed all radiographic images ordered to evaluate for the above  acute complaints and reviewed radiology reports and findings.  These findings were personally discussed with the patient.  Please see medical record for radiology report.    PROCEDURES:  Critical Care performed: No  Procedures   MEDICATIONS ORDERED IN ED: Medications  alum & mag hydroxide-simeth (MAALOX/MYLANTA) 200-200-20 MG/5ML suspension 30 mL (30 mLs Oral Given 06/02/22 2235)  iohexol (OMNIPAQUE) 350 MG/ML injection 75 mL (75 mLs Intravenous Contrast Given 06/02/22 2314)     IMPRESSION / MDM / ASSESSMENT AND PLAN / ED COURSE  I reviewed the triage vital signs and the nursing notes.                              Differential diagnosis includes, but is not limited to, ACS, pericarditis, esophagitis, pe, dissection, pna, bronchitis, costochondritis  Patient presented to the ER for evaluation of symptoms as described  above.  This presenting complaint could reflect a potentially life-threatening illness therefore the patient will be placed on continuous pulse oximetry and telemetry for monitoring.  Laboratory evaluation will be sent to evaluate for the above complaints.  Patient nontoxic-appearing.  Patient is low risk by heart score initial troponin negative EKG nonischemic seems atypical for ACS given her age will observe for second troponin.  We will add on D-dimer though I have a low suspicion for PE.  Her abdominal exam is soft and benign.  Question possible reflux we will give GI cocktail and reassess.  Her D-dimer was elevated therefore CT imaging ordered.   Clinical Course as of 06/02/22 2337  Thu Jun 02, 2022  2326 CTA on my review and interpretation does not show any evidence of saddle pulmonary embolism.  Will await formal radiology report. [PR]  2333 Patient reassessed.  She is completely pain-free and asymptomatic.  Her troponin profile is negative for ischemia.  CTA without evidence of acute intrathoracic process.  Repeat abdominal exam is soft and benign.  At this point we discussed option for observation the hospital for further chest pain rule out versus outpatient follow-up.  Patient would prefer outpatient follow-up which given her reassuring work-up and presentation is reasonable. [PR]    Clinical Course User Index [PR] Merlyn Lot, MD     FINAL CLINICAL IMPRESSION(S) / ED DIAGNOSES   Final diagnoses:  Atypical chest pain     Rx / DC Orders   ED Discharge Orders          Ordered    Ambulatory referral to Cardiology       Comments: If you have not heard from the Cardiology office within the next 72 hours please call [phone].   06/02/22 2333             Note:  This document was prepared using Dragon voice recognition software and may include unintentional dictation errors.    Merlyn Lot, MD 06/02/22 2337

## 2022-06-02 NOTE — ED Notes (Signed)
Patient transported to CT 

## 2022-06-02 NOTE — ED Triage Notes (Signed)
Pt is alert and oriented and arrived POV. Pt states she was going to eat dinner and began having severe chest pain. She was unable to eat. Pt now has an upset stomach. Pt states her pain is constant and feels like stabbing. Pt also feels short of breath

## 2022-06-03 ENCOUNTER — Other Ambulatory Visit: Payer: Self-pay

## 2022-06-03 ENCOUNTER — Inpatient Hospital Stay (HOSPITAL_COMMUNITY)
Admission: EM | Admit: 2022-06-03 | Discharge: 2022-06-06 | DRG: 282 | Disposition: A | Discharge status: Home / Self Care | Payer: 59 | Attending: Cardiovascular Disease | Admitting: Cardiovascular Disease

## 2022-06-03 ENCOUNTER — Emergency Department (HOSPITAL_COMMUNITY): Payer: 59

## 2022-06-03 ENCOUNTER — Encounter (HOSPITAL_COMMUNITY): Payer: Self-pay | Admitting: Emergency Medicine

## 2022-06-03 DIAGNOSIS — E785 Hyperlipidemia, unspecified: Secondary | ICD-10-CM | POA: Diagnosis present

## 2022-06-03 DIAGNOSIS — Z7982 Long term (current) use of aspirin: Secondary | ICD-10-CM

## 2022-06-03 DIAGNOSIS — F419 Anxiety disorder, unspecified: Secondary | ICD-10-CM | POA: Diagnosis present

## 2022-06-03 DIAGNOSIS — Z9851 Tubal ligation status: Secondary | ICD-10-CM

## 2022-06-03 DIAGNOSIS — I2510 Atherosclerotic heart disease of native coronary artery without angina pectoris: Secondary | ICD-10-CM | POA: Diagnosis present

## 2022-06-03 DIAGNOSIS — F17210 Nicotine dependence, cigarettes, uncomplicated: Secondary | ICD-10-CM | POA: Diagnosis present

## 2022-06-03 DIAGNOSIS — I214 Non-ST elevation (NSTEMI) myocardial infarction: Principal | ICD-10-CM

## 2022-06-03 DIAGNOSIS — E559 Vitamin D deficiency, unspecified: Secondary | ICD-10-CM | POA: Diagnosis present

## 2022-06-03 DIAGNOSIS — F32A Depression, unspecified: Secondary | ICD-10-CM | POA: Diagnosis present

## 2022-06-03 DIAGNOSIS — Z888 Allergy status to other drugs, medicaments and biological substances status: Secondary | ICD-10-CM

## 2022-06-03 DIAGNOSIS — Z720 Tobacco use: Secondary | ICD-10-CM

## 2022-06-03 DIAGNOSIS — E7800 Pure hypercholesterolemia, unspecified: Secondary | ICD-10-CM | POA: Diagnosis present

## 2022-06-03 DIAGNOSIS — Z79899 Other long term (current) drug therapy: Secondary | ICD-10-CM

## 2022-06-03 DIAGNOSIS — Z881 Allergy status to other antibiotic agents status: Secondary | ICD-10-CM

## 2022-06-03 DIAGNOSIS — Z8249 Family history of ischemic heart disease and other diseases of the circulatory system: Secondary | ICD-10-CM

## 2022-06-03 LAB — CBC
HCT: 38.7 % (ref 36.0–46.0)
Hemoglobin: 12.8 g/dL (ref 12.0–15.0)
MCH: 30.8 pg (ref 26.0–34.0)
MCHC: 33.1 g/dL (ref 30.0–36.0)
MCV: 93.3 fL (ref 80.0–100.0)
Platelets: 258 10*3/uL (ref 150–400)
RBC: 4.15 MIL/uL (ref 3.87–5.11)
RDW: 12.5 % (ref 11.5–15.5)
WBC: 8.7 10*3/uL (ref 4.0–10.5)
nRBC: 0 % (ref 0.0–0.2)

## 2022-06-03 LAB — BASIC METABOLIC PANEL
Anion gap: 5 (ref 5–15)
BUN: 11 mg/dL (ref 8–23)
CO2: 27 mmol/L (ref 22–32)
Calcium: 9 mg/dL (ref 8.9–10.3)
Chloride: 109 mmol/L (ref 98–111)
Creatinine, Ser: 0.6 mg/dL (ref 0.44–1.00)
GFR, Estimated: >60 mL/min (ref >60)
Glucose, Bld: 115 mg/dL — ABNORMAL HIGH (ref 70–99)
Potassium: 3.8 mmol/L (ref 3.5–5.1)
Sodium: 141 mmol/L (ref 135–145)

## 2022-06-03 LAB — TROPONIN I (HIGH SENSITIVITY): Troponin I (High Sensitivity): 334 ng/L (ref <18)

## 2022-06-03 MED ORDER — ASPIRIN 81 MG PO CHEW
324.0000 mg | CHEWABLE_TABLET | Freq: Once | ORAL | Status: AC
  Administered 2022-06-03: 324 mg via ORAL
  Filled 2022-06-03: qty 4

## 2022-06-03 MED ORDER — HEPARIN (PORCINE) 25000 UT/250ML-% IV SOLN
900.0000 [IU]/h | INTRAVENOUS | Status: DC
  Administered 2022-06-03: 750 [IU]/h via INTRAVENOUS
  Administered 2022-06-06: 900 [IU]/h via INTRAVENOUS
  Filled 2022-06-03 (×3): qty 250

## 2022-06-03 MED ORDER — HEPARIN BOLUS VIA INFUSION
4000.0000 [IU] | Freq: Once | INTRAVENOUS | Status: AC
  Administered 2022-06-03: 4000 [IU] via INTRAVENOUS
  Filled 2022-06-03: qty 4000

## 2022-06-03 MED ORDER — NITROGLYCERIN 0.4 MG SL SUBL
0.4000 mg | SUBLINGUAL_TABLET | SUBLINGUAL | Status: DC | PRN
  Administered 2022-06-04: 0.4 mg via SUBLINGUAL
  Filled 2022-06-03: qty 1

## 2022-06-03 NOTE — Consult Note (Incomplete)
Cardiology Consultation:   Patient ID: Colleen Mcneil MRN: [id number]; DOB: 12-28-1955  Admit date: 06/03/2022 Date of Consult: 06/03/2022  PCP:  Colleen Maid, MD   [place] Providers Cardiologist:  None   { Click here to update MD or APP on Care Team, Refresh:1}     Patient Profile:   Colleen Mcneil is a 66 y.o. female with a hx of *** who is being seen 06/03/2022 for the evaluation of *** at the request of ***.  History of Present Illness:   [name] ***   Past Medical History:  Diagnosis Date  . Anxiety   . Depression   . Elevated cholesterol   . Genital warts 1976   pt was treated with dyphylin  . Menopause syndrome   . Vitamin D deficiency   . Vulvar lesion     Past Surgical History:  Procedure Laterality Date  . CESAREAN SECTION  1986  . COLONOSCOPY WITH PROPOFOL N/A 05/20/2016   Procedure: COLONOSCOPY WITH PROPOFOL;  Surgeon: Colleen Lame, MD;  Location: [place];  Service: Endoscopy;  Laterality: N/A;  . HERNIA REPAIR    . POLYPECTOMY  05/20/2016   Procedure: POLYPECTOMY;  Surgeon: Colleen Lame, MD;  Location: [place];  Service: Endoscopy;;  . TUBAL LIGATION       {Home Medications (Optional):21181}  Inpatient Medications: Scheduled Meds:  Continuous Infusions: . heparin 750 Units/hr (06/03/22 2346)   PRN Meds: nitroGLYCERIN  Allergies:    Allergies  Allergen Reactions  . Macrobid [Nitrofurantoin Monohyd Macro] Nausea And Vomiting  . Nitrofurantoin Nausea And Vomiting  . Sulfamethoxazole-Trimethoprim     unknown    Social History:   Social History   Socioeconomic History  . Marital status: Widowed    Spouse name: Not on file  . Number of children: Not on file  . Years of education: Not on file  . Highest education level: Not on file  Occupational History  . Not on file  Tobacco Use  . Smoking status: Every Day    Packs/day: 0.50    Years: 25.00    Total pack years: 12.50    Types: Cigarettes   . Smokeless tobacco: Never  Vaping Use  . Vaping Use: Never used  Substance and Sexual Activity  . Alcohol use: No  . Drug use: No  . Sexual activity: Not Currently  Other Topics Concern  . Not on file  Social History Narrative  . Not on file   Social Determinants of Health   Financial Resource Strain: Not on file  Food Insecurity: Not on file  Transportation Needs: Not on file  Physical Activity: Not on file  Stress: Not on file  Social Connections: Not on file  Intimate Partner Violence: Not on file    Family History:   *** Family History  Problem Relation Age of Onset  . Heart disease Mother   . Diabetes Mother   . Diabetes Father   . Liver disease Son   . Breast cancer Neg Hx      ROS:  Please see the history of present illness.  *** All other ROS reviewed and negative.     Physical Exam/Data:   Vitals:   06/03/22 2105 06/03/22 2243 06/03/22 2315 06/03/22 2330  BP: 129/62 132/68 (!) 151/94 (!) 141/85  Pulse: (!) 59 61 (!) 58 (!) 56  Resp:  '18 14 13  '$ Temp: (!) 97.5 F (36.4 C)     TempSrc: Oral     SpO2:  100% 100% 99% 97%   No intake or output data in the 24 hours ending 06/03/22 2353    06/02/2022    8:02 PM 05/25/2022    2:13 PM 09/13/2021    8:05 AM  Last 3 Weights  Weight (lbs) 136 lb 11 oz 136 lb 141 lb 12.8 oz  Weight (kg) 62 kg 61.689 kg 64.32 kg     There is no height or weight on file to calculate BMI.  General:  Well nourished, well developed, in no acute distress*** HEENT: normal Neck: no JVD Vascular: No carotid bruits; Distal pulses 2+ bilaterally Cardiac:  normal S1, S2; RRR; no murmur *** Lungs:  clear to auscultation bilaterally, no wheezing, rhonchi or rales  Abd: soft, nontender, no hepatomegaly  Ext: no edema Musculoskeletal:  No deformities, BUE and BLE strength normal and equal Skin: warm and dry  Neuro:  CNs 2-12 intact, no focal abnormalities noted Psych:  Normal affect   EKG:  The EKG was personally reviewed and  demonstrates:  *** Telemetry:  Telemetry was personally reviewed and demonstrates:  ***  Relevant CV Studies: ***  Laboratory Data:  High Sensitivity Troponin:   Recent Labs  Lab 06/02/22 2007 06/02/22 2231 06/03/22 2137  TROPONINIHS 3 4 334*     Chemistry Recent Labs  Lab 06/02/22 2007 06/03/22 2137  NA 141 141  K 3.9 3.8  CL 110 109  CO2 27 27  GLUCOSE 138* 115*  BUN 15 11  CREATININE 0.60 0.60  CALCIUM 8.9 9.0  GFRNONAA >60 >60  ANIONGAP 4* 5    Recent Labs  Lab 06/02/22 2231  PROT 6.5  ALBUMIN 4.0  AST 16  ALT 11  ALKPHOS 45  BILITOT 0.4   Lipids No results for input(s): "CHOL", "TRIG", "HDL", "LABVLDL", "LDLCALC", "CHOLHDL" in the last 168 hours.  Hematology Recent Labs  Lab 06/02/22 2007 06/03/22 2137  WBC 6.6 8.7  RBC 3.96 4.15  HGB 12.1 12.8  HCT 36.2 38.7  MCV 91.4 93.3  MCH 30.6 30.8  MCHC 33.4 33.1  RDW 12.4 12.5  PLT 252 258   Thyroid No results for input(s): "TSH", "FREET4" in the last 168 hours.  BNPNo results for input(s): "BNP", "PROBNP" in the last 168 hours.  DDimer  Recent Labs  Lab 06/02/22 2231  DDIMER 0.53*     Radiology/Studies:  DG Chest 2 View  Result Date: 06/03/2022 CLINICAL DATA:  Chest pain EXAM: CHEST - 2 VIEW COMPARISON:  06/02/2022 FINDINGS: Lungs are clear.  No pleural effusion or pneumothorax. The heart is normal in size. Mild degenerative changes of the visualized thoracolumbar spine. IMPRESSION: Normal chest radiographs. Electronically Signed   By: Colleen Mcneil M.D.   On: 06/03/2022 22:02   CT Angio Chest PE W and/or Wo Contrast  Result Date: 06/02/2022 CLINICAL DATA:  Severe chest pain EXAM: CT ANGIOGRAPHY CHEST WITH CONTRAST TECHNIQUE: Multidetector CT imaging of the chest was performed using the standard protocol during bolus administration of intravenous contrast. Multiplanar CT image reconstructions and MIPs were obtained to evaluate the vascular anatomy. RADIATION DOSE REDUCTION: This exam was  performed according to the departmental dose-optimization program which includes automated exposure control, adjustment of the mA and/or kV according to patient size and/or use of iterative reconstruction technique. CONTRAST:  27m OMNIPAQUE IOHEXOL 350 MG/ML SOLN COMPARISON:  Chest x-ray 06/02/2022 FINDINGS: Cardiovascular: Satisfactory opacification of the pulmonary arteries to the segmental level. No evidence of pulmonary embolism. Normal heart size. No pericardial effusion. Mild aortic atherosclerosis. No  aneurysm. Coronary vascular calcification. Mediastinum/Nodes: No enlarged mediastinal, hilar, or axillary lymph nodes. Thyroid gland, trachea, and esophagus demonstrate no significant findings. Lungs/Pleura: Scarring or atelectasis at the right middle lobe. No acute consolidation, pleural effusion or pneumothorax Upper Abdomen: No acute abnormality. Musculoskeletal: No chest wall abnormality. No acute or significant osseous findings. Review of the MIP images confirms the above findings. IMPRESSION: 1. Negative for acute pulmonary embolus. 2. Clear lung fields Aortic Atherosclerosis (ICD10-I70.0). Electronically Signed   By: Donavan Foil M.D.   On: 06/02/2022 23:24   DG Chest 2 View  Result Date: 06/02/2022 CLINICAL DATA:  Chest pain EXAM: CHEST - 2 VIEW COMPARISON:  10/21/2020 FINDINGS: The heart size and mediastinal contours are within normal limits. Aortic atherosclerosis. Both lungs are clear. Mild scoliosis IMPRESSION: No active cardiopulmonary disease. Electronically Signed   By: Donavan Foil M.D.   On: 06/02/2022 20:55     Assessment and Plan:   ***   Risk Assessment/Risk Scores:  {Complete the following score calculators/questions to meet required metrics.  Press F2         :[id number]}   {Is the patient being seen for CHEST PAIN, UNSTABLE ANGINA, NSTEMI or STEMI?     :[id number]} {Does this patient have CHF or CHF symptoms?      :[id number]} {Does this patient have ATRIAL  FIBRILLATION?:[phone]}  {Are we signing off today?:[id number]}  For questions or updates, please contact [place] Please consult [email or web address] for contact info under    Signed, Warren Danes, MD  06/03/2022 11:53 PM

## 2022-06-03 NOTE — Consult Note (Deleted)
Cardiology Consultation:   Patient ID: Colleen Mcneil MRN: [id number]; DOB: Jul 11, 1956  Admit date: 06/03/2022 Date of Consult: 06/03/2022  PCP:  Rubie Maid, MD   [place] Providers Cardiologist:  None        Patient Profile:   Colleen Mcneil is a 66 y.o. female with no significant past medical history who is being seen 06/03/2022 for the evaluation of chest pain at the request of emergency medicine Dr.  History of Present Illness:   Colleen Mcneil is a 66 year old occasion female presenting to hospital with chest pain.  Past medical history significant for anxiety, depression, hypercholesterolemia, active smoking (half a pack a day).  Patient reports being fairly active at work however she does not want less than 60 systolic.  Patient reports significant stressful event at work 06/02/22. On the evening of the same-day patient developed chest pain prompting emergency room visit.  At that time troponins flat, EKG was unremarkable ACS was ruled out.  Patient went home she was able to fall asleep following morning she woke up this still having chest pain and reports feeling like "she is out of  gas".  Ongoing chest pain without any particular exacerbating or relieving factors.  No correlation with exertion.  Patient has any palpitations, presyncope or syncope.  Patient has any heart failure symptoms.  Patient has any fevers, chills, nausea, vomiting, diarrhea.  Patient has any recent sick contacts. While in emergency room patient is hemodynamically stable, distress, was normal sinus rhythm with no significant ST changes initial troponin was 334 with follow-up of 380.   Past Medical History:  Diagnosis Date   Anxiety    Depression    Elevated cholesterol    Genital warts 1976   pt was treated with dyphylin   Menopause syndrome    Vitamin D deficiency    Vulvar lesion     Past Surgical History:  Procedure Laterality Date   CESAREAN SECTION  1986   COLONOSCOPY WITH PROPOFOL  N/A 05/20/2016   Procedure: COLONOSCOPY WITH PROPOFOL;  Surgeon: Lucilla Lame, MD;  Location: [facility];  Service: Endoscopy;  Laterality: N/A;   HERNIA REPAIR     POLYPECTOMY  05/20/2016   Procedure: POLYPECTOMY;  Surgeon: Lucilla Lame, MD;  Location: [facility];  Service: Endoscopy;;   TUBAL LIGATION       Home Medications:  Prior to Admission medications   Medication Sig Start Date End Date Taking? Authorizing Provider  acetaminophen (TYLENOL) 325 MG tablet Take 325 mg by mouth every 4 (four) hours as needed for mild pain. 10/09/19  Yes [provider]  aspirin EC 81 MG tablet Take 81 mg by mouth every 4 (four) hours as needed for mild pain. Swallow whole.   Yes [provider]  clonazePAM (KLONOPIN) 1 MG tablet TAKE 1 TABLET DAILY AS NEEDED FOR ANXIETY Patient taking differently: Take 0.5 mg by mouth daily as needed for anxiety. 03/16/22  Yes Rubie Maid, MD  FLUoxetine (PROZAC) 10 MG capsule Take 1 capsule (10 mg total) by mouth daily. TAKE (1) CAPSULES BY MOUTH ONCE DAILY. Patient taking differently: Take 10 mg by mouth daily. 03/17/22  Yes Rubie Maid, MD  Cholecalciferol (VITAMIN D3) 5000 units CAPS Take 1 capsule (5,000 Units total) by mouth daily. Patient not taking: Reported on 05/25/2022 02/27/17   Joylene Igo, CNM    Inpatient Medications: Scheduled Meds:  Continuous Infusions:  heparin 750 Units/hr (06/03/22 2346)   PRN Meds: nitroGLYCERIN  Allergies:  Allergies  Allergen Reactions   Macrobid [Nitrofurantoin Monohyd Macro] Nausea And Vomiting   Nitrofurantoin Nausea And Vomiting   Sulfamethoxazole-Trimethoprim     unknown    Social History:   Social History   Socioeconomic History   Marital status: Widowed    Spouse name: Not on file   Number of children: Not on file   Years of education: Not on file   Highest education level: Not on file  Occupational History   Not on file  Tobacco Use   Smoking status: Every  Day    Packs/day: 0.50    Years: 25.00    Total pack years: 12.50    Types: Cigarettes   Smokeless tobacco: Never  Vaping Use   Vaping Use: Never used  Substance and Sexual Activity   Alcohol use: No   Drug use: No   Sexual activity: Not Currently  Other Topics Concern   Not on file  Social History Narrative   Not on file   Social Determinants of Health   Financial Resource Strain: Not on file  Food Insecurity: Not on file  Transportation Needs: Not on file  Physical Activity: Not on file  Stress: Not on file  Social Connections: Not on file  Intimate Partner Violence: Not on file    Family History:    Family History  Problem Relation Age of Onset   Heart disease Mother    Diabetes Mother    Diabetes Father    Liver disease Son    Breast cancer Neg Hx      ROS:  Please see the history of present illness.   All other ROS reviewed and negative.     Physical Exam/Data:   Vitals:   06/03/22 2105 06/03/22 2243 06/03/22 2315 06/03/22 2330  BP: 129/62 132/68 (!) 151/94 (!) 141/85  Pulse: (!) 59 61 (!) 58 (!) 56  Resp:  '18 14 13  '$ Temp: (!) 97.5 F (36.4 C)     TempSrc: Oral     SpO2: 100% 100% 99% 97%   No intake or output data in the 24 hours ending 06/03/22 2353    06/02/2022    8:02 PM 05/25/2022    2:13 PM 09/13/2021    8:05 AM  Last 3 Weights  Weight (lbs) 136 lb 11 oz 136 lb 141 lb 12.8 oz  Weight (kg) 62 kg 61.689 kg 64.32 kg     There is no height or weight on file to calculate BMI.  General:  Well nourished, well developed, in no acute distress HEENT: normal Neck: no JVD Vascular: No carotid bruits; Distal pulses 2+ bilaterally Cardiac:  normal S1, S2; RRR; no murmur  Lungs:  clear to auscultation bilaterally, no wheezing, rhonchi or rales  Abd: soft, nontender, no hepatomegaly  Ext: no edema Musculoskeletal:  No deformities, BUE and BLE strength normal and equal Skin: warm and dry  Neuro:  CNs 2-12 intact, no focal abnormalities noted Psych:   Normal affect   EKG:  The EKG was personally reviewed and demonstrates: Normal sinus rhythm without significant ST changes.  Telemetry:  Telemetry was personally reviewed and demonstrates:  Isolated PVCs  Relevant CV Studies: CT angio chest done 06/02/2022 showed evidence of PE however with evidence of coronary calcification  Laboratory Data:  High Sensitivity Troponin:   Recent Labs  Lab 06/02/22 2007 06/02/22 2231 06/03/22 2137  TROPONINIHS 3 4 334*     Chemistry Recent Labs  Lab 06/02/22 2007 06/03/22 2137  NA 141 141  K 3.9 3.8  CL 110 109  CO2 27 27  GLUCOSE 138* 115*  BUN 15 11  CREATININE 0.60 0.60  CALCIUM 8.9 9.0  GFRNONAA >60 >60  ANIONGAP 4* 5    Recent Labs  Lab 06/02/22 2231  PROT 6.5  ALBUMIN 4.0  AST 16  ALT 11  ALKPHOS 45  BILITOT 0.4   Lipids No results for input(s): "CHOL", "TRIG", "HDL", "LABVLDL", "LDLCALC", "CHOLHDL" in the last 168 hours.  Hematology Recent Labs  Lab 06/02/22 2007 06/03/22 2137  WBC 6.6 8.7  RBC 3.96 4.15  HGB 12.1 12.8  HCT 36.2 38.7  MCV 91.4 93.3  MCH 30.6 30.8  MCHC 33.4 33.1  RDW 12.4 12.5  PLT 252 258   Thyroid No results for input(s): "TSH", "FREET4" in the last 168 hours.  BNPNo results for input(s): "BNP", "PROBNP" in the last 168 hours.  DDimer  Recent Labs  Lab 06/02/22 2231  DDIMER 0.53*     Radiology/Studies:  DG Chest 2 View  Result Date: 06/03/2022 CLINICAL DATA:  Chest pain EXAM: CHEST - 2 VIEW COMPARISON:  06/02/2022 FINDINGS: Lungs are clear.  No pleural effusion or pneumothorax. The heart is normal in size. Mild degenerative changes of the visualized thoracolumbar spine. IMPRESSION: Normal chest radiographs. Electronically Signed   By: Julian Hy M.D.   On: 06/03/2022 22:02   CT Angio Chest PE W and/or Wo Contrast  Result Date: 06/02/2022 CLINICAL DATA:  Severe chest pain EXAM: CT ANGIOGRAPHY CHEST WITH CONTRAST TECHNIQUE: Multidetector CT imaging of the chest was performed  using the standard protocol during bolus administration of intravenous contrast. Multiplanar CT image reconstructions and MIPs were obtained to evaluate the vascular anatomy. RADIATION DOSE REDUCTION: This exam was performed according to the departmental dose-optimization program which includes automated exposure control, adjustment of the mA and/or kV according to patient size and/or use of iterative reconstruction technique. CONTRAST:  38m OMNIPAQUE IOHEXOL 350 MG/ML SOLN COMPARISON:  Chest x-ray 06/02/2022 FINDINGS: Cardiovascular: Satisfactory opacification of the pulmonary arteries to the segmental level. No evidence of pulmonary embolism. Normal heart size. No pericardial effusion. Mild aortic atherosclerosis. No aneurysm. Coronary vascular calcification. Mediastinum/Nodes: No enlarged mediastinal, hilar, or axillary lymph nodes. Thyroid gland, trachea, and esophagus demonstrate no significant findings. Lungs/Pleura: Scarring or atelectasis at the right middle lobe. No acute consolidation, pleural effusion or pneumothorax Upper Abdomen: No acute abnormality. Musculoskeletal: No chest wall abnormality. No acute or significant osseous findings. Review of the MIP images confirms the above findings. IMPRESSION: 1. Negative for acute pulmonary embolus. 2. Clear lung fields Aortic Atherosclerosis (ICD10-I70.0). Electronically Signed   By: KDonavan FoilM.D.   On: 06/02/2022 23:24   DG Chest 2 View  Result Date: 06/02/2022 CLINICAL DATA:  Chest pain EXAM: CHEST - 2 VIEW COMPARISON:  10/21/2020 FINDINGS: The heart size and mediastinal contours are within normal limits. Aortic atherosclerosis. Both lungs are clear. Mild scoliosis IMPRESSION: No active cardiopulmonary disease. Electronically Signed   By: KDonavan FoilM.D.   On: 06/02/2022 20:55     Assessment and Plan:  Low risk NSTEMI. TIMI risk score of 2 Patient is currently chest pain-free. Chest tightness reproducible on palpation Hemodynamically  stable No significant ventricular arrhythmia Plan. Trend troponin until peak Follow-up lipid profile and A1c. Follow-up tte in the morning. Start asa 81 mg po daily Start metoprolol tartrate 25 mg p.o. twice daily. Start Crestor 20 mg Daily Will plan for lhc -tentatively within the next 48 hours. N.p.o. Risk Assessment/Risk Scores:  TIMI Risk Score for Unstable Angina or Non-ST Elevation MI:   The patient's TIMI risk score is  , which indicates a  % risk of all cause mortality, new or recurrent myocardial infarction or need for urgent revascularization in the next 14 days.          For questions or updates, please contact [place] Please consult [email or web address] for contact info under    Signed, Warren Danes, MD  06/03/2022 11:53 PM

## 2022-06-03 NOTE — ED Provider Notes (Addendum)
[facility] EMERGENCY DEPARTMENT Provider Note   CSN: [id number] Arrival date & time: 06/03/22  2031     History  Chief Complaint  Patient presents with   Chest Pain    Colleen Mcneil is a 66 y.o. female.  The history is provided by the patient and medical records.  Chest Pain Colleen Mcneil is a 66 y.o. female who presents to the Emergency Department complaining of chest pain.  She presents to the emergency department accompanied by her daughter.  She reports left-sided chest pain that started at 6:30 PM last night that is described as a constant heavy pressure sensation.  She was evaluated in the emergency department at [facility] yesterday and had labs and CTA that were unremarkable.  Her pain was improving and she was discharged home.  She states that today her pain returned and worsened.  She feels lightheaded and weak.  No shortness of breath.  No abdominal pain, nausea, vomiting, leg swelling or pain, fevers.  She has no significant medical history.  She does have a family history of coronary artery disease in her mother at the age of 73.   Smokes tobacco.  No alchol drugs.       Home Medications Prior to Admission medications   Medication Sig Start Date End Date Taking? Authorizing Provider  acetaminophen (TYLENOL) 325 MG tablet Take 325 mg by mouth every 4 (four) hours as needed for mild pain. 10/09/19  Yes [provider]  aspirin EC 81 MG tablet Take 81 mg by mouth every 4 (four) hours as needed for mild pain. Swallow whole.   Yes [provider]  clonazePAM (KLONOPIN) 1 MG tablet TAKE 1 TABLET DAILY AS NEEDED FOR ANXIETY Patient taking differently: Take 0.5 mg by mouth daily as needed for anxiety. 03/16/22  Yes Hildred Laser, MD  FLUoxetine (PROZAC) 10 MG capsule Take 1 capsule (10 mg total) by mouth daily. TAKE (1) CAPSULES BY MOUTH ONCE DAILY. Patient taking differently: Take 10 mg by mouth daily. 03/17/22   Yes Hildred Laser, MD  Cholecalciferol (VITAMIN D3) 5000 units CAPS Take 1 capsule (5,000 Units total) by mouth daily. Patient not taking: Reported on 05/25/2022 02/27/17   Shambley, Melody N, CNM      Allergies    Macrobid [nitrofurantoin monohyd macro], Nitrofurantoin, and Sulfamethoxazole-trimethoprim    Review of Systems   Review of Systems  Cardiovascular:  Positive for chest pain.  All other systems reviewed and are negative.   Physical Exam Updated Vital Signs BP (!) 152/85 (BP Location: Right Arm)   Pulse (!) 58   Temp 99.1 F (37.3 C) (Oral)   Resp 18   Ht 5\' 6"  (1.676 m)   Wt 61.3 kg   SpO2 97%   BMI 21.81 kg/m  Physical Exam Vitals and nursing note reviewed.  Constitutional:      Appearance: She is well-developed.  HENT:     Head: Normocephalic and atraumatic.  Cardiovascular:     Rate and Rhythm: Normal rate and regular rhythm.     Heart sounds: No murmur heard. Pulmonary:     Effort: Pulmonary effort is normal. No respiratory distress.     Breath sounds: Normal breath sounds.  Abdominal:     Palpations: Abdomen is soft.     Tenderness: There is no abdominal tenderness. There is no guarding or rebound.  Musculoskeletal:        General: No swelling or tenderness.  Skin:    General:  Skin is warm and dry.  Neurological:     Mental Status: She is alert and oriented to person, place, and time.  Psychiatric:        Behavior: Behavior normal.     ED Results / Procedures / Treatments   Labs (all labs ordered are listed, but only abnormal results are displayed) Labs Reviewed  BASIC METABOLIC PANEL - Abnormal; Notable for the following components:      Result Value   Glucose, Bld 115 (*)    All other components within normal limits  HEMOGLOBIN A1C - Abnormal; Notable for the following components:   Hgb A1c MFr Bld 5.8 (*)    All other components within normal limits  LIPID PANEL - Abnormal; Notable for the following components:   LDL Cholesterol 142 (*)     All other components within normal limits  TROPONIN I (HIGH SENSITIVITY) - Abnormal; Notable for the following components:   Troponin I (High Sensitivity) 334 (*)    All other components within normal limits  TROPONIN I (HIGH SENSITIVITY) - Abnormal; Notable for the following components:   Troponin I (High Sensitivity) 380 (*)    All other components within normal limits  TROPONIN I (HIGH SENSITIVITY) - Abnormal; Notable for the following components:   Troponin I (High Sensitivity) 436 (*)    All other components within normal limits  CBC  HIV ANTIBODY (ROUTINE TESTING W REFLEX)  HEPARIN LEVEL (UNFRACTIONATED)  LIPOPROTEIN A (LPA)  TROPONIN I (HIGH SENSITIVITY)    EKG None  Radiology DG Chest 2 View  Result Date: 06/03/2022 CLINICAL DATA:  Chest pain EXAM: CHEST - 2 VIEW COMPARISON:  06/02/2022 FINDINGS: Lungs are clear.  No pleural effusion or pneumothorax. The heart is normal in size. Mild degenerative changes of the visualized thoracolumbar spine. IMPRESSION: Normal chest radiographs. Electronically Signed   By: Charline Bills M.D.   On: 06/03/2022 22:02   CT Angio Chest PE W and/or Wo Contrast  Result Date: 06/02/2022 CLINICAL DATA:  Severe chest pain EXAM: CT ANGIOGRAPHY CHEST WITH CONTRAST TECHNIQUE: Multidetector CT imaging of the chest was performed using the standard protocol during bolus administration of intravenous contrast. Multiplanar CT image reconstructions and MIPs were obtained to evaluate the vascular anatomy. RADIATION DOSE REDUCTION: This exam was performed according to the departmental dose-optimization program which includes automated exposure control, adjustment of the mA and/or kV according to patient size and/or use of iterative reconstruction technique. CONTRAST:  75mL OMNIPAQUE IOHEXOL 350 MG/ML SOLN COMPARISON:  Chest x-ray 06/02/2022 FINDINGS: Cardiovascular: Satisfactory opacification of the pulmonary arteries to the segmental level. No evidence of  pulmonary embolism. Normal heart size. No pericardial effusion. Mild aortic atherosclerosis. No aneurysm. Coronary vascular calcification. Mediastinum/Nodes: No enlarged mediastinal, hilar, or axillary lymph nodes. Thyroid gland, trachea, and esophagus demonstrate no significant findings. Lungs/Pleura: Scarring or atelectasis at the right middle lobe. No acute consolidation, pleural effusion or pneumothorax Upper Abdomen: No acute abnormality. Musculoskeletal: No chest wall abnormality. No acute or significant osseous findings. Review of the MIP images confirms the above findings. IMPRESSION: 1. Negative for acute pulmonary embolus. 2. Clear lung fields Aortic Atherosclerosis (ICD10-I70.0). Electronically Signed   By: Jasmine Pang M.D.   On: 06/02/2022 23:24   DG Chest 2 View  Result Date: 06/02/2022 CLINICAL DATA:  Chest pain EXAM: CHEST - 2 VIEW COMPARISON:  10/21/2020 FINDINGS: The heart size and mediastinal contours are within normal limits. Aortic atherosclerosis. Both lungs are clear. Mild scoliosis IMPRESSION: No active cardiopulmonary disease. Electronically Signed  By: Jasmine Pang M.D.   On: 06/02/2022 20:55    Procedures Procedures  CRITICAL CARE Performed by: Tilden Fossa   Total critical care time: 35 minutes  Critical care time was exclusive of separately billable procedures and treating other patients.  Critical care was necessary to treat or prevent imminent or life-threatening deterioration.  Critical care was time spent personally by me on the following activities: development of treatment plan with patient and/or surrogate as well as nursing, discussions with consultants, evaluation of patient's response to treatment, examination of patient, obtaining history from patient or surrogate, ordering and performing treatments and interventions, ordering and review of laboratory studies, ordering and review of radiographic studies, pulse oximetry and re-evaluation of patient's  condition.   Medications Ordered in ED Medications  nitroGLYCERIN (NITROSTAT) SL tablet 0.4 mg (has no administration in time range)  heparin ADULT infusion 100 units/mL (25000 units/253mL) (750 Units/hr Intravenous Handoff 06/04/22 0715)  aspirin EC tablet 81 mg (0 mg Oral Duplicate 06/04/22 1000)  acetaminophen (TYLENOL) tablet 650 mg (has no administration in time range)  ondansetron (ZOFRAN) injection 4 mg (has no administration in time range)  metoprolol tartrate (LOPRESSOR) tablet 25 mg (0 mg Oral Hold 06/04/22 0507)  rosuvastatin (CRESTOR) tablet 20 mg (0 mg Oral Duplicate 06/04/22 1000)  FLUoxetine (PROZAC) capsule 10 mg (0 mg Oral Duplicate 06/04/22 1000)  clonazePAM (KLONOPIN) tablet 0.5 mg (has no administration in time range)  aspirin chewable tablet 324 mg (324 mg Oral Given 06/03/22 2338)  heparin bolus via infusion 4,000 Units (4,000 Units Intravenous Bolus from Bag 06/03/22 2347)    ED Course/ Medical Decision Making/ A&P                           Medical Decision Making Amount and/or Complexity of Data Reviewed Labs: ordered.  Risk OTC drugs. Prescription drug management. Decision regarding hospitalization.   Patient here for evaluation of left-sided chest pain.  EKG is without acute ischemic changes.  Troponin is elevated when compared to priors.  She was treated with aspirin, nitroglycerin as well as heparin.  Current clinical picture is not consistent with PE, pneumonia, dissection.  Discussed with patient concern for acute cardiac event and recommendation to admit for ongoing treatment.  Cardiology consulted for admission for ongoing care.        Final Clinical Impression(s) / ED Diagnoses Final diagnoses:  NSTEMI (non-ST elevated myocardial infarction) [facility])    Rx / DC Orders ED Discharge Orders     None         Tilden Fossa, MD 06/04/22 0800    Tilden Fossa, MD 06/04/22 0800

## 2022-06-03 NOTE — ED Triage Notes (Signed)
Patient reports persistent central chest pain with dizziness and mild SOB onset yesterday radiating to left shoulder and left arm . No emesis or diaphoresis .

## 2022-06-03 NOTE — ED Provider Triage Note (Signed)
Emergency Medicine Provider Triage Evaluation Note  Colleen Mcneil , a 66 y.o. female  was evaluated in triage.  Pt complains of chest pain. States that same began initially yesterday and she was seen for same at [facility] and had unremarkable cardiac work-up with normal CTA chest as well. Admission for observation was discussed but ultimately patient was discharged. States that her symptoms worsened this morning with more severe pain in her substernal region that radiates to her left side and down through her left arm as well. Endorses some SOB but denies n/v. No cardiac history. Does smoke 0.5 pack/day.  Review of Systems  Positive:  Negative:   Physical Exam  BP 129/62   Pulse (!) 59   Temp (!) 97.5 F (36.4 C) (Oral)   SpO2 100%  Gen:   Awake, no distress   Resp:  Normal effort  MSK:   Moves extremities without difficulty  Other:    Medical Decision Making  Medically screening exam initiated at 9:34 PM.  Appropriate orders placed.  Colleen Mcneil was informed that the remainder of the evaluation will be completed by another provider, this initial triage assessment does not replace that evaluation, and the importance of remaining in the ED until their evaluation is complete.     Nestor Lewandowsky 06/03/22 2142

## 2022-06-04 ENCOUNTER — Other Ambulatory Visit: Payer: Self-pay

## 2022-06-04 ENCOUNTER — Encounter (HOSPITAL_COMMUNITY): Payer: Self-pay | Admitting: Internal Medicine

## 2022-06-04 DIAGNOSIS — Z881 Allergy status to other antibiotic agents status: Secondary | ICD-10-CM | POA: Diagnosis not present

## 2022-06-04 DIAGNOSIS — I2510 Atherosclerotic heart disease of native coronary artery without angina pectoris: Secondary | ICD-10-CM | POA: Diagnosis present

## 2022-06-04 DIAGNOSIS — F17210 Nicotine dependence, cigarettes, uncomplicated: Secondary | ICD-10-CM | POA: Diagnosis present

## 2022-06-04 DIAGNOSIS — I214 Non-ST elevation (NSTEMI) myocardial infarction: Secondary | ICD-10-CM | POA: Diagnosis present

## 2022-06-04 DIAGNOSIS — E7800 Pure hypercholesterolemia, unspecified: Secondary | ICD-10-CM | POA: Diagnosis present

## 2022-06-04 DIAGNOSIS — Z8249 Family history of ischemic heart disease and other diseases of the circulatory system: Secondary | ICD-10-CM | POA: Diagnosis not present

## 2022-06-04 DIAGNOSIS — F32A Depression, unspecified: Secondary | ICD-10-CM | POA: Diagnosis present

## 2022-06-04 DIAGNOSIS — Z7982 Long term (current) use of aspirin: Secondary | ICD-10-CM | POA: Diagnosis not present

## 2022-06-04 DIAGNOSIS — F419 Anxiety disorder, unspecified: Secondary | ICD-10-CM | POA: Diagnosis present

## 2022-06-04 DIAGNOSIS — Z9851 Tubal ligation status: Secondary | ICD-10-CM | POA: Diagnosis not present

## 2022-06-04 DIAGNOSIS — Z888 Allergy status to other drugs, medicaments and biological substances status: Secondary | ICD-10-CM | POA: Diagnosis not present

## 2022-06-04 DIAGNOSIS — Z79899 Other long term (current) drug therapy: Secondary | ICD-10-CM | POA: Diagnosis not present

## 2022-06-04 DIAGNOSIS — E559 Vitamin D deficiency, unspecified: Secondary | ICD-10-CM | POA: Diagnosis present

## 2022-06-04 LAB — LIPID PANEL
Cholesterol: 200 mg/dL (ref 0–200)
HDL: 46 mg/dL (ref >40)
LDL Cholesterol: 142 mg/dL — ABNORMAL HIGH (ref 0–99)
Total CHOL/HDL Ratio: 4.3 RATIO
Triglycerides: 62 mg/dL (ref <150)
VLDL: 12 mg/dL (ref 0–40)

## 2022-06-04 LAB — HIV ANTIBODY (ROUTINE TESTING W REFLEX): HIV Screen 4th Generation wRfx: NONREACTIVE

## 2022-06-04 LAB — HEMOGLOBIN A1C
Hgb A1c MFr Bld: 5.8 % — ABNORMAL HIGH (ref 4.8–5.6)
Mean Plasma Glucose: 119.76 mg/dL

## 2022-06-04 LAB — TROPONIN I (HIGH SENSITIVITY)
Troponin I (High Sensitivity): 380 ng/L (ref <18)
Troponin I (High Sensitivity): 436 ng/L (ref <18)
Troponin I (High Sensitivity): 528 ng/L (ref <18)

## 2022-06-04 LAB — HEPARIN LEVEL (UNFRACTIONATED)
Heparin Unfractionated: 0.39 IU/mL (ref 0.30–0.70)
Heparin Unfractionated: 0.21 IU/mL — ABNORMAL LOW (ref 0.30–0.70)

## 2022-06-04 MED ORDER — SODIUM CHLORIDE 0.9% FLUSH: 3.0000 mL | INTRAVENOUS | Status: DC | PRN

## 2022-06-04 MED ORDER — METOPROLOL TARTRATE 25 MG PO TABS
25.0000 mg | ORAL_TABLET | Freq: Two times a day (BID) | ORAL | Status: DC
Start: 2022-06-04 — End: 2022-06-06
  Administered 2022-06-04 – 2022-06-05 (×4): 25 mg via ORAL
  Filled 2022-06-04 (×5): qty 1

## 2022-06-04 MED ORDER — CLONAZEPAM 0.5 MG PO TABS
0.5000 mg | ORAL_TABLET | Freq: Every day | ORAL | Status: DC | PRN
  Administered 2022-06-04 – 2022-06-05 (×2): 0.5 mg via ORAL
  Filled 2022-06-04 (×3): qty 1

## 2022-06-04 MED ORDER — ASPIRIN 300 MG RE SUPP: 300.0000 mg | RECTAL | Status: DC

## 2022-06-04 MED ORDER — ASPIRIN 81 MG PO TBEC
81.0000 mg | DELAYED_RELEASE_TABLET | Freq: Every day | ORAL | Status: DC
Start: 2022-06-04 — End: 2022-06-06
  Administered 2022-06-04 – 2022-06-05 (×2): 81 mg via ORAL
  Filled 2022-06-04 (×3): qty 1

## 2022-06-04 MED ORDER — ONDANSETRON HCL 4 MG/2ML IJ SOLN
4.0000 mg | Freq: Four times a day (QID) | INTRAMUSCULAR | Status: DC | PRN
Start: 2022-06-04 — End: 2022-06-06

## 2022-06-04 MED ORDER — SODIUM CHLORIDE 0.9 % WEIGHT BASED INFUSION
1.0000 mL/kg/h | INTRAVENOUS | Status: DC
  Administered 2022-06-06: 250 mL via INTRAVENOUS

## 2022-06-04 MED ORDER — NITROGLYCERIN IN D5W 200-5 MCG/ML-% IV SOLN: 0.0000 ug/min | INTRAVENOUS | Status: DC

## 2022-06-04 MED ORDER — NITROGLYCERIN 0.4 MG SL SUBL: 0.4000 mg | SUBLINGUAL_TABLET | SUBLINGUAL | Status: DC | PRN

## 2022-06-04 MED ORDER — SODIUM CHLORIDE 0.9% FLUSH
3.0000 mL | Freq: Two times a day (BID) | INTRAVENOUS | Status: DC
  Administered 2022-06-04 – 2022-06-05 (×2): 3 mL via INTRAVENOUS

## 2022-06-04 MED ORDER — ACETAMINOPHEN 325 MG PO TABS
650.0000 mg | ORAL_TABLET | ORAL | Status: DC | PRN
Start: 2022-06-04 — End: 2022-06-06

## 2022-06-04 MED ORDER — ROSUVASTATIN CALCIUM 20 MG PO TABS
20.0000 mg | ORAL_TABLET | Freq: Every day | ORAL | Status: DC
  Administered 2022-06-04: 20 mg via ORAL
  Filled 2022-06-04: qty 1

## 2022-06-04 MED ORDER — ASPIRIN 81 MG PO CHEW: 324.0000 mg | CHEWABLE_TABLET | ORAL | Status: DC

## 2022-06-04 MED ORDER — SODIUM CHLORIDE 0.9 % WEIGHT BASED INFUSION
3.0000 mL/kg/h | INTRAVENOUS | Status: DC
  Administered 2022-06-06: 3 mL/kg/h via INTRAVENOUS

## 2022-06-04 MED ORDER — FLUOXETINE HCL 10 MG PO CAPS
10.0000 mg | ORAL_CAPSULE | Freq: Every day | ORAL | Status: DC
  Administered 2022-06-04 – 2022-06-06 (×3): 10 mg via ORAL
  Filled 2022-06-04 (×4): qty 1

## 2022-06-04 MED ORDER — ASPIRIN 81 MG PO CHEW
81.0000 mg | CHEWABLE_TABLET | ORAL | Status: AC
  Administered 2022-06-06: 81 mg via ORAL
  Filled 2022-06-04: qty 1

## 2022-06-04 MED ORDER — SODIUM CHLORIDE 0.9 % IV SOLN: 250.0000 mL | INTRAVENOUS | Status: DC | PRN

## 2022-06-04 NOTE — ED Notes (Signed)
Notified Dr. Wilmon Pali of third troponin of 436. Awaiting orders.

## 2022-06-04 NOTE — Progress Notes (Addendum)
ANTICOAGULATION CONSULT NOTE - Follow Up  Pharmacy Consult for Heparin  Indication: chest pain/ACS  Allergies  Allergen Reactions   Macrobid [Nitrofurantoin Monohyd Macro] Nausea And Vomiting   Nitrofurantoin Nausea And Vomiting   Sulfamethoxazole-Trimethoprim     unknown   Vital Signs: Temp: 97.5 F (36.4 C) (07/28 2105) Temp Source: Oral (07/28 2105) BP: 157/94 (07/29 0100) Pulse Rate: 66 (07/29 0100)  Labs: Recent Labs    06/02/22 2007 06/02/22 2231 06/03/22 2137 06/03/22 2340  HGB 12.1  --  12.8  --   HCT 36.2  --  38.7  --   PLT 252  --  258  --   CREATININE 0.60  --  0.60  --   TROPONINIHS 3 4 334* 380*    Estimated Creatinine Clearance: 65.6 mL/min (by C-G formula based on SCr of 0.6 mg/dL).   Medical History: Past Medical History:  Diagnosis Date   Anxiety    Depression    Elevated cholesterol    Genital warts 1976   pt was treated with dyphylin   Menopause syndrome    Vitamin D deficiency    Vulvar lesion      Assessment: 66 y/o F to start heparin for CP/elevated troponin. CBC/renal function ok, PTA meds reviewed. Patient's heparin level came back 0.39 therapeutic, renal function and CBC are stable, no signs/symptoms of bleeding or issues with the infusion running.  Goal of Therapy:  Heparin level 0.3-0.7 units/ml Monitor platelets by anticoagulation protocol: Yes   Plan:  Continue heparin drip at 750 units/hr F/u heparin level in 6 hours  Daily CBC/Heparin level Monitor for bleeding   Sandford Craze, PharmD. [facility] Acute Care PGY-1  06/04/2022 9:43 AM

## 2022-06-04 NOTE — Progress Notes (Signed)
Returned to bed from BR. Denies CP or SOB. Somewhat bradycardic at times with HR in upper 50's at times, otherwise vital signs stable. BP = 135/77. Metoprolol 25 mg po given at this time. Will monitor.

## 2022-06-04 NOTE — Progress Notes (Signed)
C/O 4/10 left side CP radiating to Left arm. EKG done. NTG sl x 1 given. Pain is now 2/10. BP 110/70's from 120/80's before NTG

## 2022-06-04 NOTE — Plan of Care (Signed)
  Problem: Activity: Goal: Ability to tolerate increased activity will improve Outcome: Progressing   Problem: Cardiac: Goal: Ability to achieve and maintain adequate cardiovascular perfusion will improve Outcome: Progressing   

## 2022-06-04 NOTE — H&P (Signed)
Cardiology Admission History and Physical:   Patient ID: Colleen Mcneil MRN: [id number]; DOB: 11-29-1955   Admission date: 06/03/2022  PCP:  Rubie Maid, MD   [facility] Providers Cardiologist:  None        Chief Complaint:  chest pain  Patient Profile:     Colleen Mcneil is a 66 y.o. female with no significant past medical history who is being seen 06/03/2022 for the evaluation of chest pain at the request of emergency medicine Dr.  History of Present Illness:   Ms. Colleen Mcneil is a 66 year old occasion female presenting to hospital with chest pain.  Past medical history significant for anxiety, depression, hypercholesterolemia, active smoking (half a pack a day).  Patient reports being fairly active at work however she does not want less than 60 systolic.  Patient reports significant stressful event at work 06/02/22. On the evening of the same-day patient developed chest pain prompting emergency room visit.  At that time troponins flat, EKG was unremarkable ACS was ruled out.  Patient went home she was able to fall asleep following morning she woke up this still having chest pain and reports feeling like "she is out of  gas".  Ongoing chest pain without any particular exacerbating or relieving factors.  No correlation with exertion.  Patient has any palpitations, presyncope or syncope.  Patient has any heart failure symptoms.  Patient has any fevers, chills, nausea, vomiting, diarrhea.  Patient has any recent sick contacts. While in emergency room patient is hemodynamically stable, distress, was normal sinus rhythm with no significant ST changes initial troponin was 334 with follow-up of 380.   Past Medical History:  Diagnosis Date   Anxiety    Depression    Elevated cholesterol    Genital warts 1976   pt was treated with dyphylin   Menopause syndrome    Vitamin D deficiency    Vulvar lesion     Past Surgical History:  Procedure Laterality Date   CESAREAN SECTION   1986   COLONOSCOPY WITH PROPOFOL N/A 05/20/2016   Procedure: COLONOSCOPY WITH PROPOFOL;  Surgeon: Lucilla Lame, MD;  Location: [place];  Service: Endoscopy;  Laterality: N/A;   HERNIA REPAIR     POLYPECTOMY  05/20/2016   Procedure: POLYPECTOMY;  Surgeon: Lucilla Lame, MD;  Location: [place];  Service: Endoscopy;;   TUBAL LIGATION       Medications Prior to Admission: Prior to Admission medications   Medication Sig Start Date End Date Taking? Authorizing Provider  acetaminophen (TYLENOL) 325 MG tablet Take 325 mg by mouth every 4 (four) hours as needed for mild pain. 10/09/19  Yes [provider]  aspirin EC 81 MG tablet Take 81 mg by mouth every 4 (four) hours as needed for mild pain. Swallow whole.   Yes [provider]  clonazePAM (KLONOPIN) 1 MG tablet TAKE 1 TABLET DAILY AS NEEDED FOR ANXIETY Patient taking differently: Take 0.5 mg by mouth daily as needed for anxiety. 03/16/22  Yes Rubie Maid, MD  FLUoxetine (PROZAC) 10 MG capsule Take 1 capsule (10 mg total) by mouth daily. TAKE (1) CAPSULES BY MOUTH ONCE DAILY. Patient taking differently: Take 10 mg by mouth daily. 03/17/22  Yes Rubie Maid, MD  Cholecalciferol (VITAMIN D3) 5000 units CAPS Take 1 capsule (5,000 Units total) by mouth daily. Patient not taking: Reported on 05/25/2022 02/27/17   Joylene Igo, CNM     Allergies:    Allergies  Allergen Reactions   Macrobid [Nitrofurantoin Monohyd  Macro] Nausea And Vomiting   Nitrofurantoin Nausea And Vomiting   Sulfamethoxazole-Trimethoprim     unknown    Social History:   Social History   Socioeconomic History   Marital status: Widowed    Spouse name: Not on file   Number of children: Not on file   Years of education: Not on file   Highest education level: Not on file  Occupational History   Not on file  Tobacco Use   Smoking status: Every Day    Packs/day: 0.50    Years: 25.00    Total pack years: 12.50    Types:  Cigarettes   Smokeless tobacco: Never  Vaping Use   Vaping Use: Never used  Substance and Sexual Activity   Alcohol use: No   Drug use: No   Sexual activity: Not Currently  Other Topics Concern   Not on file  Social History Narrative   Not on file   Social Determinants of Health   Financial Resource Strain: Not on file  Food Insecurity: Not on file  Transportation Needs: Not on file  Physical Activity: Not on file  Stress: Not on file  Social Connections: Not on file  Intimate Partner Violence: Not on file    Family History:   The patient's family history includes Diabetes in her father and mother; Heart disease in her mother; Liver disease in her son. There is no history of Breast cancer.    ROS:  Please see the history of present illness.  All other ROS reviewed and negative.     Physical Exam/Data:   Vitals:   06/04/22 0015 06/04/22 0030 06/04/22 0045 06/04/22 0100  BP: (!) 141/84 (!) 159/86 (!) 148/89 (!) 157/94  Pulse: (!) 56 61 (!) 58 66  Resp: '12 13 15 13  '$ Temp:      TempSrc:      SpO2: 96% 99% 96% 97%   No intake or output data in the 24 hours ending 06/04/22 0122    06/02/2022    8:02 PM 05/25/2022    2:13 PM 09/13/2021    8:05 AM  Last 3 Weights  Weight (lbs) 136 lb 11 oz 136 lb 141 lb 12.8 oz  Weight (kg) 62 kg 61.689 kg 64.32 kg     There is no height or weight on file to calculate BMI.  General:  Well nourished, well developed, in no acute distress HEENT: normal Neck: no JVD Vascular: No carotid bruits; Distal pulses 2+ bilaterally Cardiac:  normal S1, S2; RRR; no murmur  Lungs:  clear to auscultation bilaterally, no wheezing, rhonchi or rales  Abd: soft, nontender, no hepatomegaly  Ext: no edema Musculoskeletal:  No deformities, BUE and BLE strength normal and equal Skin: warm and dry  Neuro:  CNs 2-12 intact, no focal abnormalities noted Psych:  Normal affect    EKG:  The EKG was personally reviewed and demonstrates: Normal sinus rhythm  without significant ST changes.  Telemetry:  Telemetry was personally reviewed and demonstrates:  Isolated PVCs  Relevant CV Studies: CT angio chest done 06/02/2022 showed evidence of PE however with evidence of coronary calcification  Laboratory Data:  High Sensitivity Troponin:   Recent Labs  Lab 06/02/22 2007 06/02/22 2231 06/03/22 2137 06/03/22 2340  TROPONINIHS 3 4 334* 380*      Chemistry Recent Labs  Lab 06/02/22 2007 06/03/22 2137  NA 141 141  K 3.9 3.8  CL 110 109  CO2 27 27  GLUCOSE 138* 115*  BUN 15  11  CREATININE 0.60 0.60  CALCIUM 8.9 9.0  GFRNONAA >60 >60  ANIONGAP 4* 5    Recent Labs  Lab 06/02/22 2231  PROT 6.5  ALBUMIN 4.0  AST 16  ALT 11  ALKPHOS 45  BILITOT 0.4   Lipids No results for input(s): "CHOL", "TRIG", "HDL", "LABVLDL", "LDLCALC", "CHOLHDL" in the last 168 hours. Hematology Recent Labs  Lab 06/02/22 2007 06/03/22 2137  WBC 6.6 8.7  RBC 3.96 4.15  HGB 12.1 12.8  HCT 36.2 38.7  MCV 91.4 93.3  MCH 30.6 30.8  MCHC 33.4 33.1  RDW 12.4 12.5  PLT 252 258   Thyroid No results for input(s): "TSH", "FREET4" in the last 168 hours. BNPNo results for input(s): "BNP", "PROBNP" in the last 168 hours.  DDimer  Recent Labs  Lab 06/02/22 2231  DDIMER 0.53*     Radiology/Studies:  DG Chest 2 View  Result Date: 06/03/2022 CLINICAL DATA:  Chest pain EXAM: CHEST - 2 VIEW COMPARISON:  06/02/2022 FINDINGS: Lungs are clear.  No pleural effusion or pneumothorax. The heart is normal in size. Mild degenerative changes of the visualized thoracolumbar spine. IMPRESSION: Normal chest radiographs. Electronically Signed   By: Julian Hy M.D.   On: 06/03/2022 22:02     Assessment and Plan:  Low risk NSTEMI. TIMI risk score of 2 Patient is currently chest pain-free. Chest tightness reproducible on palpation Hemodynamically stable No significant ventricular arrhythmia Plan. Trend troponin until peak Follow-up lipid profile and  A1c. Follow-up tte in the morning. Start asa 81 mg po daily Start metoprolol tartrate 25 mg p.o. twice daily. Start Crestor 20 mg Daily Will plan for lhc -tentatively within the next 48 hours. N.p.o.  Active smoking. Aggressive smoking cessation intervention. Nicotine patch offered  Risk Assessment/Risk Scores:    TIMI Risk Score for Unstable Angina or Non-ST Elevation MI:   The patient's TIMI risk score is 2 , which indicates a  8% risk of all cause mortality, new or recurrent myocardial infarction or need for urgent revascularization in the next 14 days.       Severity of Illness: The appropriate patient status for this patient is OBSERVATION. Observation status is judged to be reasonable and necessary in order to provide the required intensity of service to ensure the patient's safety. The patient's presenting symptoms, physical exam findings, and initial radiographic and laboratory data in the context of their medical condition is felt to place them at decreased risk for further clinical deterioration. Furthermore, it is anticipated that the patient will be medically stable for discharge from the hospital within 2 midnights of admission.    For questions or updates, please contact [place] Please consult [email or web address] for contact info under     Signed, Warren Danes, MD  06/04/2022 1:22 AM

## 2022-06-04 NOTE — Progress Notes (Signed)
Unable to dual sign-off Heparin at shift change d/t pharmacy being in the chart at the time. Heparin currently running at 7.34m/hr. Verified by GBoyd Kerbs RN.

## 2022-06-04 NOTE — ED Notes (Signed)
Cardiology at bedside.

## 2022-06-04 NOTE — Progress Notes (Signed)
Patient seen and examined.  Regular rhythm, no murmurs, lungs clear.  Patient has continued to have chest pain overnight despite IV heparin.  She has received sublingual nitroglycerin with improvement in her pain.  Her troponin is significantly elevated.  We Nathanuel Cabreja plan for left heart catheterization on Monday.  Echocardiogram today.  If pain returns, would start nitroglycerin drip.  Zhara Gieske Curt Bears, MD 06/04/2022 9:19 AM

## 2022-06-04 NOTE — Progress Notes (Signed)
ANTICOAGULATION CONSULT NOTE - Follow Up  Pharmacy Consult for Heparin  Indication: chest pain/ACS  Allergies  Allergen Reactions   Macrobid [Nitrofurantoin Monohyd Macro] Nausea And Vomiting   Nitrofurantoin Nausea And Vomiting   Sulfamethoxazole-Trimethoprim     unknown   HEPARIN DW (KG): 61.3   Vital Signs: Temp: 99.1 F (37.3 C) (07/29 1744) Temp Source: Oral (07/29 1744) BP: 130/77 (07/29 1744) Pulse Rate: 59 (07/29 1744)  Labs: Recent Labs    06/02/22 2007 06/02/22 2231 06/03/22 2137 06/03/22 2340 06/04/22 0222 06/04/22 0724 06/04/22 1559  HGB 12.1  --  12.8  --   --   --   --   HCT 36.2  --  38.7  --   --   --   --   PLT 252  --  258  --   --   --   --   HEPARINUNFRC  --   --   --   --   --  0.39 0.21*  CREATININE 0.60  --  0.60  --   --   --   --   TROPONINIHS 3   < > 334* 380* 436* 528*  --    < > = values in this interval not displayed.     Estimated Creatinine Clearance: 65.6 mL/min (by C-G formula based on SCr of 0.6 mg/dL).   Medical History: Past Medical History:  Diagnosis Date   Anxiety    Depression    Elevated cholesterol    Genital warts 1976   pt was treated with dyphylin   Menopause syndrome    Vitamin D deficiency    Vulvar lesion      Assessment: 66 y/o F to start heparin for CP/elevated troponin. Renal function at baseline. No anticoagulation PTA. No history of significant bleed events. Pharmacy consulted for heparin consult.    Patient's heparin level came back 0.21 subtherapeutic on heparin drip rate 750 units/hr. Hgb 12.8 and plt 258. No s/sx bleeding noted in patient chart.    Goal of Therapy:  Heparin level 0.3-0.7 units/ml Monitor platelets by anticoagulation protocol: Yes   Plan:  Increase heparin drip to 900 units/hr Monitor daily CBC/heparin level Continue to monitor H&H  Gena Fray, PharmD PGY1 Pharmacy Resident   06/04/2022 6:15 PM

## 2022-06-05 ENCOUNTER — Other Ambulatory Visit (HOSPITAL_COMMUNITY): Payer: 59

## 2022-06-05 ENCOUNTER — Inpatient Hospital Stay (HOSPITAL_COMMUNITY): Payer: 59

## 2022-06-05 DIAGNOSIS — I214 Non-ST elevation (NSTEMI) myocardial infarction: Secondary | ICD-10-CM | POA: Diagnosis not present

## 2022-06-05 LAB — HEPARIN LEVEL (UNFRACTIONATED)
Heparin Unfractionated: 0.4 IU/mL (ref 0.30–0.70)
Heparin Unfractionated: 0.35 IU/mL (ref 0.30–0.70)

## 2022-06-05 LAB — BASIC METABOLIC PANEL
Anion gap: 5 (ref 5–15)
BUN: 15 mg/dL (ref 8–23)
CO2: 25 mmol/L (ref 22–32)
Calcium: 8.8 mg/dL — ABNORMAL LOW (ref 8.9–10.3)
Chloride: 108 mmol/L (ref 98–111)
Creatinine, Ser: 0.61 mg/dL (ref 0.44–1.00)
GFR, Estimated: >60 mL/min (ref >60)
Glucose, Bld: 111 mg/dL — ABNORMAL HIGH (ref 70–99)
Potassium: 4.1 mmol/L (ref 3.5–5.1)
Sodium: 138 mmol/L (ref 135–145)

## 2022-06-05 LAB — CBC
HCT: 37.5 % (ref 36.0–46.0)
Hemoglobin: 12.4 g/dL (ref 12.0–15.0)
MCH: 30.6 pg (ref 26.0–34.0)
MCHC: 33.1 g/dL (ref 30.0–36.0)
MCV: 92.6 fL (ref 80.0–100.0)
Platelets: 240 10*3/uL (ref 150–400)
RBC: 4.05 MIL/uL (ref 3.87–5.11)
RDW: 12.3 % (ref 11.5–15.5)
WBC: 8.2 10*3/uL (ref 4.0–10.5)
nRBC: 0 % (ref 0.0–0.2)

## 2022-06-05 LAB — ECHOCARDIOGRAM COMPLETE
Area-P 1/2: 2.87 cm2
Height: 66 in
S' Lateral: 2.7 cm
Weight: 2161.6 oz

## 2022-06-05 LAB — LIPOPROTEIN A (LPA): Lipoprotein (a): 193.5 nmol/L — ABNORMAL HIGH (ref <75.0)

## 2022-06-05 LAB — MAGNESIUM: Magnesium: 1.9 mg/dL (ref 1.7–2.4)

## 2022-06-05 MED ORDER — ROSUVASTATIN CALCIUM 20 MG PO TABS
40.0000 mg | ORAL_TABLET | Freq: Every day | ORAL | Status: DC
  Administered 2022-06-05 – 2022-06-06 (×2): 40 mg via ORAL
  Filled 2022-06-05 (×2): qty 2

## 2022-06-05 NOTE — Progress Notes (Signed)
Echocardiogram 2D Echocardiogram has been performed.  Oneal Deputy Razan Siler RDCS 06/05/2022, 2:11 PM

## 2022-06-05 NOTE — Progress Notes (Signed)
Patient declined cath video  

## 2022-06-05 NOTE — H&P (View-Only) (Signed)
Progress Note  Patient Name: Colleen Mcneil Date of Encounter: 06/05/2022  [facility] Cardiologist: None   Subjective   Feeling well.  No further chest pain.  Resting comfortably in bed.  Inpatient Medications    Scheduled Meds:  [START ON 06/06/2022] aspirin  81 mg Oral Pre-Cath   aspirin EC  81 mg Oral Daily   FLUoxetine  10 mg Oral Daily   metoprolol tartrate  25 mg Oral BID   rosuvastatin  20 mg Oral Daily   sodium chloride flush  3 mL Intravenous Q12H   Continuous Infusions:  [START ON 06/06/2022] sodium chloride     [START ON 06/06/2022] sodium chloride     Followed by   [START ON 06/06/2022] sodium chloride     heparin 900 Units/hr (06/04/22 2037)   nitroGLYCERIN Stopped (06/04/22 1211)   PRN Meds: [START ON 06/06/2022] sodium chloride, acetaminophen, clonazePAM, nitroGLYCERIN, ondansetron (ZOFRAN) IV, sodium chloride flush   Vital Signs    Vitals:   06/04/22 1536 06/04/22 1744 06/04/22 2048 06/05/22 0715  BP: 108/63 130/77 124/82 121/86  Pulse: (!) 51 (!) 59 62   Resp: '18 16 18 16  '$ Temp: 98.9 F (37.2 C) 99.1 F (37.3 C) 98.9 F (37.2 C)   TempSrc: Oral Oral Oral   SpO2: 98% 98% 96% 100%  Weight:      Height:        Intake/Output Summary (Last 24 hours) at 06/05/2022 0820 Last data filed at 06/05/2022 0300 Gross per 24 hour  Intake 317.04 ml  Output --  Net 317.04 ml      06/04/2022    6:30 AM 06/02/2022    8:02 PM 05/25/2022    2:13 PM  Last 3 Weights  Weight (lbs) 135 lb 1.6 oz 136 lb 11 oz 136 lb  Weight (kg) 61.281 kg 62 kg 61.689 kg      Telemetry    Sinus rhythm- Personally Reviewed  ECG    None new- Personally Reviewed  Physical Exam   GEN: No acute distress.   Neck: No JVD Cardiac: RRR, no murmurs, rubs, or gallops.  Respiratory: Clear to auscultation bilaterally. GI: Soft, nontender, non-distended  MS: No edema; No deformity. Neuro:  Nonfocal  Psych: Normal affect   Labs    High Sensitivity Troponin:   Recent  Labs  Lab 06/02/22 2231 06/03/22 2137 06/03/22 2340 06/04/22 0222 06/04/22 0724  TROPONINIHS 4 334* 380* 436* 528*     Chemistry Recent Labs  Lab 06/02/22 2007 06/02/22 2231 06/03/22 2137 06/05/22 0309  NA 141  --  141 138  K 3.9  --  3.8 4.1  CL 110  --  109 108  CO2 27  --  27 25  GLUCOSE 138*  --  115* 111*  BUN 15  --  11 15  CREATININE 0.60  --  0.60 0.61  CALCIUM 8.9  --  9.0 8.8*  PROT  --  6.5  --   --   ALBUMIN  --  4.0  --   --   AST  --  16  --   --   ALT  --  11  --   --   ALKPHOS  --  45  --   --   BILITOT  --  0.4  --   --   GFRNONAA >60  --  >60 >60  ANIONGAP 4*  --  5 5    Lipids  Recent Labs  Lab 06/04/22 0222  CHOL 200  TRIG 62  HDL 46  LDLCALC 142*  CHOLHDL 4.3    Hematology Recent Labs  Lab 06/02/22 2007 06/03/22 2137 06/05/22 0309  WBC 6.6 8.7 8.2  RBC 3.96 4.15 4.05  HGB 12.1 12.8 12.4  HCT 36.2 38.7 37.5  MCV 91.4 93.3 92.6  MCH 30.6 30.8 30.6  MCHC 33.4 33.1 33.1  RDW 12.4 12.5 12.3  PLT 252 258 240   Thyroid No results for input(s): "TSH", "FREET4" in the last 168 hours.  BNPNo results for input(s): "BNP", "PROBNP" in the last 168 hours.  DDimer  Recent Labs  Lab 06/02/22 2231  DDIMER 0.53*     Radiology    DG Chest 2 View  Result Date: 06/03/2022 CLINICAL DATA:  Chest pain EXAM: CHEST - 2 VIEW COMPARISON:  06/02/2022 FINDINGS: Lungs are clear.  No pleural effusion or pneumothorax. The heart is normal in size. Mild degenerative changes of the visualized thoracolumbar spine. IMPRESSION: Normal chest radiographs. Electronically Signed   By: Julian Hy M.D.   On: 06/03/2022 22:02    Cardiac Studies   TTE pending  Patient Profile     66 y.o. female who presented to the hospital, found to have elevated troponins consistent with non-STEMI.  Assessment & Plan    1.  Non-STEMI: Currently on IV heparin.  Fortunately she is chest pain-free.  Her troponins became elevated consistent with non-STEMI.  We Oluwateniola Leitch plan  for left heart catheterization tomorrow.  Patient is n.p.o. after midnight.  Risk and benefits have been discussed.  She understands these risks and is agreed to the procedure.  The patient understands that risks include but are not limited to stroke (1 in 1000), death (1 in 25), kidney failure [usually temporary] (1 in 500), bleeding (1 in 200), allergic reaction [possibly serious] (1 in 200), and agrees to proceed.  2.  Tobacco abuse: Has been counseled on smoking cessation.  3.  Hyperlipidemia: LDL of 142.  Merrilyn Legler increase Crestor to 40 mg.  For questions or updates, please contact [place] Please consult [email or web address] for contact info under        Signed, Anjalina Bergevin Meredith Leeds, MD  06/05/2022, 8:20 AM

## 2022-06-05 NOTE — Progress Notes (Signed)
ANTICOAGULATION CONSULT NOTE - Follow Up  Pharmacy Consult for Heparin  Indication: chest pain/ACS  Allergies  Allergen Reactions   Macrobid [Nitrofurantoin Monohyd Macro] Nausea And Vomiting   Nitrofurantoin Nausea And Vomiting   Sulfamethoxazole-Trimethoprim     unknown   HEPARIN DW (KG): 61.3   Vital Signs: Temp: 98.2 F (36.8 C) (07/30 0715) BP: 121/86 (07/30 0715)  Labs: Recent Labs    06/02/22 2007 06/02/22 2231 06/03/22 2137 06/03/22 2340 06/04/22 0222 06/04/22 0724 06/04/22 0724 06/04/22 1559 06/05/22 0309 06/05/22 1209  HGB 12.1  --  12.8  --   --   --   --   --  12.4  --   HCT 36.2  --  38.7  --   --   --   --   --  37.5  --   PLT 252  --  258  --   --   --   --   --  240  --   HEPARINUNFRC  --   --   --   --   --  0.39   < > 0.21* 0.40 0.35  CREATININE 0.60  --  0.60  --   --   --   --   --  0.61  --   TROPONINIHS 3   < > 334* 380* 436* 528*  --   --   --   --    < > = values in this interval not displayed.     Estimated Creatinine Clearance: 65.6 mL/min (by C-G formula based on SCr of 0.61 mg/dL).   Medical History: Past Medical History:  Diagnosis Date   Anxiety    Depression    Elevated cholesterol    Genital warts 1976   pt was treated with dyphylin   Menopause syndrome    Vitamin D deficiency    Vulvar lesion      Assessment: 66 y/o F to start heparin for CP/elevated troponin. Renal function at baseline. No anticoagulation PTA. No history of significant bleed events. Pharmacy consulted for heparin consult.    Patient's heparin level came back 0.35 therapeutic on heparin drip rate 900 units/hr. Hgb 12.4 and plt 240. No s/sx bleeding or issues running the heparin drip.      Goal of Therapy:  Heparin level 0.3-0.7 units/ml Monitor platelets by anticoagulation protocol: Yes   Plan:  Continue heparin 900 units/hr Monitor heparin level daily Monitor Hgb and CBC daily  Sandford Craze, PharmD. [facility] Acute Care PGY-1  06/05/2022  1:33 PM

## 2022-06-05 NOTE — Progress Notes (Signed)
Progress Note  Patient Name: Colleen Mcneil Date of Encounter: 06/05/2022  [facility] Cardiologist: None   Subjective   Feeling well.  No further chest pain.  Resting comfortably in bed.  Inpatient Medications    Scheduled Meds:  [START ON 06/06/2022] aspirin  81 mg Oral Pre-Cath   aspirin EC  81 mg Oral Daily   FLUoxetine  10 mg Oral Daily   metoprolol tartrate  25 mg Oral BID   rosuvastatin  20 mg Oral Daily   sodium chloride flush  3 mL Intravenous Q12H   Continuous Infusions:  [START ON 06/06/2022] sodium chloride     [START ON 06/06/2022] sodium chloride     Followed by   [START ON 06/06/2022] sodium chloride     heparin 900 Units/hr (06/04/22 2037)   nitroGLYCERIN Stopped (06/04/22 1211)   PRN Meds: [START ON 06/06/2022] sodium chloride, acetaminophen, clonazePAM, nitroGLYCERIN, ondansetron (ZOFRAN) IV, sodium chloride flush   Vital Signs    Vitals:   06/04/22 1536 06/04/22 1744 06/04/22 2048 06/05/22 0715  BP: 108/63 130/77 124/82 121/86  Pulse: (!) 51 (!) 59 62   Resp: '18 16 18 16  '$ Temp: 98.9 F (37.2 C) 99.1 F (37.3 C) 98.9 F (37.2 C)   TempSrc: Oral Oral Oral   SpO2: 98% 98% 96% 100%  Weight:      Height:        Intake/Output Summary (Last 24 hours) at 06/05/2022 0820 Last data filed at 06/05/2022 0300 Gross per 24 hour  Intake 317.04 ml  Output --  Net 317.04 ml      06/04/2022    6:30 AM 06/02/2022    8:02 PM 05/25/2022    2:13 PM  Last 3 Weights  Weight (lbs) 135 lb 1.6 oz 136 lb 11 oz 136 lb  Weight (kg) 61.281 kg 62 kg 61.689 kg      Telemetry    Sinus rhythm- Personally Reviewed  ECG    None new- Personally Reviewed  Physical Exam   GEN: No acute distress.   Neck: No JVD Cardiac: RRR, no murmurs, rubs, or gallops.  Respiratory: Clear to auscultation bilaterally. GI: Soft, nontender, non-distended  MS: No edema; No deformity. Neuro:  Nonfocal  Psych: Normal affect   Labs    High Sensitivity Troponin:   Recent  Labs  Lab 06/02/22 2231 06/03/22 2137 06/03/22 2340 06/04/22 0222 06/04/22 0724  TROPONINIHS 4 334* 380* 436* 528*     Chemistry Recent Labs  Lab 06/02/22 2007 06/02/22 2231 06/03/22 2137 06/05/22 0309  NA 141  --  141 138  K 3.9  --  3.8 4.1  CL 110  --  109 108  CO2 27  --  27 25  GLUCOSE 138*  --  115* 111*  BUN 15  --  11 15  CREATININE 0.60  --  0.60 0.61  CALCIUM 8.9  --  9.0 8.8*  PROT  --  6.5  --   --   ALBUMIN  --  4.0  --   --   AST  --  16  --   --   ALT  --  11  --   --   ALKPHOS  --  45  --   --   BILITOT  --  0.4  --   --   GFRNONAA >60  --  >60 >60  ANIONGAP 4*  --  5 5    Lipids  Recent Labs  Lab 06/04/22 0222  CHOL 200  TRIG 62  HDL 46  LDLCALC 142*  CHOLHDL 4.3    Hematology Recent Labs  Lab 06/02/22 2007 06/03/22 2137 06/05/22 0309  WBC 6.6 8.7 8.2  RBC 3.96 4.15 4.05  HGB 12.1 12.8 12.4  HCT 36.2 38.7 37.5  MCV 91.4 93.3 92.6  MCH 30.6 30.8 30.6  MCHC 33.4 33.1 33.1  RDW 12.4 12.5 12.3  PLT 252 258 240   Thyroid No results for input(s): "TSH", "FREET4" in the last 168 hours.  BNPNo results for input(s): "BNP", "PROBNP" in the last 168 hours.  DDimer  Recent Labs  Lab 06/02/22 2231  DDIMER 0.53*     Radiology    DG Chest 2 View  Result Date: 06/03/2022 CLINICAL DATA:  Chest pain EXAM: CHEST - 2 VIEW COMPARISON:  06/02/2022 FINDINGS: Lungs are clear.  No pleural effusion or pneumothorax. The heart is normal in size. Mild degenerative changes of the visualized thoracolumbar spine. IMPRESSION: Normal chest radiographs. Electronically Signed   By: Julian Hy M.D.   On: 06/03/2022 22:02    Cardiac Studies   TTE pending  Patient Profile     66 y.o. female who presented to the hospital, found to have elevated troponins consistent with non-STEMI.  Assessment & Plan    1.  Non-STEMI: Currently on IV heparin.  Fortunately she is chest pain-free.  Her troponins became elevated consistent with non-STEMI.  We Livvy Spilman plan  for left heart catheterization tomorrow.  Patient is n.p.o. after midnight.  Risk and benefits have been discussed.  She understands these risks and is agreed to the procedure.  The patient understands that risks include but are not limited to stroke (1 in 1000), death (1 in 42), kidney failure [usually temporary] (1 in 500), bleeding (1 in 200), allergic reaction [possibly serious] (1 in 200), and agrees to proceed.  2.  Tobacco abuse: Has been counseled on smoking cessation.  3.  Hyperlipidemia: LDL of 142.  Tamaj Jurgens increase Crestor to 40 mg.  For questions or updates, please contact [place] Please consult [email or web address] for contact info under        Signed, Jorma Tassinari Meredith Leeds, MD  06/05/2022, 8:20 AM

## 2022-06-05 NOTE — Progress Notes (Signed)
ANTICOAGULATION CONSULT NOTE - Follow Up  Pharmacy Consult for Heparin  Indication: chest pain/ACS  Allergies  Allergen Reactions   Macrobid [Nitrofurantoin Monohyd Macro] Nausea And Vomiting   Nitrofurantoin Nausea And Vomiting   Sulfamethoxazole-Trimethoprim     unknown   HEPARIN DW (KG): 61.3   Vital Signs: Temp: 98.9 F (37.2 C) (07/29 2048) Temp Source: Oral (07/29 2048) BP: 124/82 (07/29 2048) Pulse Rate: 62 (07/29 2048)  Labs: Recent Labs    06/02/22 2007 06/02/22 2231 06/03/22 2137 06/03/22 2340 06/04/22 0222 06/04/22 0724 06/04/22 1559 06/05/22 0309  HGB 12.1  --  12.8  --   --   --   --  12.4  HCT 36.2  --  38.7  --   --   --   --  37.5  PLT 252  --  258  --   --   --   --  240  HEPARINUNFRC  --   --   --   --   --  0.39 0.21* 0.40  CREATININE 0.60  --  0.60  --   --   --   --  0.61  TROPONINIHS 3   < > 334* 380* 436* 528*  --   --    < > = values in this interval not displayed.     Estimated Creatinine Clearance: 65.6 mL/min (by C-G formula based on SCr of 0.61 mg/dL).   Medical History: Past Medical History:  Diagnosis Date   Anxiety    Depression    Elevated cholesterol    Genital warts 1976   pt was treated with dyphylin   Menopause syndrome    Vitamin D deficiency    Vulvar lesion      Assessment: 66 y/o F to start heparin for CP/elevated troponin. Renal function at baseline. No anticoagulation PTA. No history of significant bleed events. Pharmacy consulted for heparin consult.    Patient's heparin level came back 0.21 subtherapeutic on heparin drip rate 750 units/hr. Hgb 12.8 and plt 258. No s/sx bleeding noted in patient chart.   7/30 AM update:  Heparin level therapeutic    Goal of Therapy:  Heparin level 0.3-0.7 units/ml Monitor platelets by anticoagulation protocol: Yes   Plan:  Cont heparin 900 units/hr 1200 heparin level  Narda Bonds, PharmD, BCPS Clinical Pharmacist Phone: [phone]

## 2022-06-06 ENCOUNTER — Encounter (HOSPITAL_COMMUNITY): Payer: Self-pay | Admitting: Cardiovascular Disease

## 2022-06-06 ENCOUNTER — Encounter (HOSPITAL_COMMUNITY)
Admission: EM | Disposition: A | Discharge status: Home / Self Care | Payer: Self-pay | Source: Home / Self Care | Attending: Cardiovascular Disease

## 2022-06-06 ENCOUNTER — Other Ambulatory Visit (HOSPITAL_COMMUNITY): Payer: Self-pay

## 2022-06-06 DIAGNOSIS — I2510 Atherosclerotic heart disease of native coronary artery without angina pectoris: Secondary | ICD-10-CM | POA: Diagnosis not present

## 2022-06-06 DIAGNOSIS — Z720 Tobacco use: Secondary | ICD-10-CM

## 2022-06-06 DIAGNOSIS — I214 Non-ST elevation (NSTEMI) myocardial infarction: Secondary | ICD-10-CM | POA: Diagnosis not present

## 2022-06-06 HISTORY — PX: INTRAVASCULAR PRESSURE WIRE/FFR STUDY: CATH118243

## 2022-06-06 HISTORY — PX: LEFT HEART CATH AND CORONARY ANGIOGRAPHY: CATH118249

## 2022-06-06 LAB — CBC
HCT: 37.6 % (ref 36.0–46.0)
Hemoglobin: 12.9 g/dL (ref 12.0–15.0)
MCH: 31.2 pg (ref 26.0–34.0)
MCHC: 34.3 g/dL (ref 30.0–36.0)
MCV: 90.8 fL (ref 80.0–100.0)
Platelets: 208 10*3/uL (ref 150–400)
RBC: 4.14 MIL/uL (ref 3.87–5.11)
RDW: 12.1 % (ref 11.5–15.5)
WBC: 7.3 10*3/uL (ref 4.0–10.5)
nRBC: 0 % (ref 0.0–0.2)

## 2022-06-06 LAB — HEPARIN LEVEL (UNFRACTIONATED): Heparin Unfractionated: 0.45 IU/mL (ref 0.30–0.70)

## 2022-06-06 SURGERY — LEFT HEART CATH AND CORONARY ANGIOGRAPHY
Anesthesia: LOCAL

## 2022-06-06 MED ORDER — ROSUVASTATIN CALCIUM 40 MG PO TABS
40.0000 mg | ORAL_TABLET | Freq: Every day | ORAL | 1 refills | Status: DC
  Filled 2022-06-06: qty 30, 30d supply, fill #0

## 2022-06-06 MED ORDER — HEPARIN (PORCINE) IN NACL 1000-0.9 UT/500ML-% IV SOLN
INTRAVENOUS | Status: AC
  Filled 2022-06-06: qty 1000

## 2022-06-06 MED ORDER — HEPARIN SODIUM (PORCINE) 1000 UNIT/ML IJ SOLN
INTRAMUSCULAR | Status: DC | PRN
  Administered 2022-06-06: 4000 [IU] via INTRAVENOUS
  Administered 2022-06-06: 3000 [IU] via INTRAVENOUS

## 2022-06-06 MED ORDER — MIDAZOLAM HCL 2 MG/2ML IJ SOLN
INTRAMUSCULAR | Status: DC | PRN
  Administered 2022-06-06: 1 mg via INTRAVENOUS

## 2022-06-06 MED ORDER — FENTANYL CITRATE (PF) 100 MCG/2ML IJ SOLN
INTRAMUSCULAR | Status: DC | PRN
  Administered 2022-06-06: 25 ug via INTRAVENOUS

## 2022-06-06 MED ORDER — SODIUM CHLORIDE 0.9% FLUSH: 3.0000 mL | INTRAVENOUS | Status: DC | PRN

## 2022-06-06 MED ORDER — MIDAZOLAM HCL 2 MG/2ML IJ SOLN
INTRAMUSCULAR | Status: AC
  Filled 2022-06-06: qty 2

## 2022-06-06 MED ORDER — LABETALOL HCL 5 MG/ML IV SOLN: 10.0000 mg | INTRAVENOUS | Status: AC | PRN

## 2022-06-06 MED ORDER — NITROGLYCERIN 0.4 MG SL SUBL
0.4000 mg | SUBLINGUAL_TABLET | SUBLINGUAL | 2 refills | Status: DC | PRN
  Filled 2022-06-06: qty 25, 14d supply, fill #0

## 2022-06-06 MED ORDER — HYDRALAZINE HCL 20 MG/ML IJ SOLN: 10.0000 mg | INTRAMUSCULAR | Status: AC | PRN

## 2022-06-06 MED ORDER — HEPARIN (PORCINE) IN NACL 1000-0.9 UT/500ML-% IV SOLN
INTRAVENOUS | Status: DC | PRN
  Administered 2022-06-06 (×2): 500 mL

## 2022-06-06 MED ORDER — MORPHINE SULFATE (PF) 2 MG/ML IV SOLN: 2.0000 mg | INTRAVENOUS | Status: DC | PRN

## 2022-06-06 MED ORDER — SODIUM CHLORIDE 0.9% FLUSH: 3.0000 mL | Freq: Two times a day (BID) | INTRAVENOUS | Status: DC

## 2022-06-06 MED ORDER — SODIUM CHLORIDE 0.9 % IV SOLN: 250.0000 mL | INTRAVENOUS | Status: DC | PRN

## 2022-06-06 MED ORDER — ACETAMINOPHEN 325 MG PO TABS: 650.0000 mg | ORAL_TABLET | ORAL | Status: DC | PRN

## 2022-06-06 MED ORDER — NITROGLYCERIN 1 MG/10 ML FOR IR/CATH LAB
INTRA_ARTERIAL | Status: AC
  Filled 2022-06-06: qty 10

## 2022-06-06 MED ORDER — ASPIRIN 81 MG PO CHEW: 81.0000 mg | CHEWABLE_TABLET | Freq: Every day | ORAL | Status: DC

## 2022-06-06 MED ORDER — ONDANSETRON HCL 4 MG/2ML IJ SOLN: 4.0000 mg | Freq: Four times a day (QID) | INTRAMUSCULAR | Status: DC | PRN

## 2022-06-06 MED ORDER — LIDOCAINE HCL (PF) 1 % IJ SOLN
INTRAMUSCULAR | Status: AC
  Filled 2022-06-06: qty 30

## 2022-06-06 MED ORDER — HEPARIN SODIUM (PORCINE) 1000 UNIT/ML IJ SOLN
INTRAMUSCULAR | Status: AC
  Filled 2022-06-06: qty 10

## 2022-06-06 MED ORDER — VERAPAMIL HCL 2.5 MG/ML IV SOLN
INTRA_ARTERIAL | Status: DC | PRN
  Administered 2022-06-06 (×2): 5 mL via INTRA_ARTERIAL

## 2022-06-06 MED ORDER — ISOSORBIDE MONONITRATE ER 30 MG PO TB24
30.0000 mg | ORAL_TABLET | Freq: Every day | ORAL | 1 refills | Status: DC
  Filled 2022-06-06: qty 30, 30d supply, fill #0

## 2022-06-06 MED ORDER — IOHEXOL 350 MG/ML SOLN
INTRAVENOUS | Status: DC | PRN
  Administered 2022-06-06: 110 mL

## 2022-06-06 MED ORDER — VERAPAMIL HCL 2.5 MG/ML IV SOLN
INTRAVENOUS | Status: AC
  Filled 2022-06-06: qty 2

## 2022-06-06 MED ORDER — FENTANYL CITRATE (PF) 100 MCG/2ML IJ SOLN
INTRAMUSCULAR | Status: AC
  Filled 2022-06-06: qty 2

## 2022-06-06 MED ORDER — LIDOCAINE HCL (PF) 1 % IJ SOLN
INTRAMUSCULAR | Status: DC | PRN
  Administered 2022-06-06: 2 mL

## 2022-06-06 MED ORDER — SODIUM CHLORIDE 0.9 % IV SOLN: INTRAVENOUS | Status: AC

## 2022-06-06 SURGICAL SUPPLY — 14 items
BAND ZEPHYR COMPRESS 30 LONG (HEMOSTASIS) ×1 IMPLANT
CATH OPTITORQUE TIG 4.0 5F (CATHETERS) ×1 IMPLANT
CATH VISTA GUIDE 6FR XBLAD3.0 (CATHETERS) ×1 IMPLANT
ELECT DEFIB PAD ADLT CADENCE (PAD) ×1 IMPLANT
GLIDESHEATH SLEND A-KIT 6F 22G (SHEATH) ×1 IMPLANT
GUIDEWIRE INQWIRE 1.5J.035X260 (WIRE) IMPLANT
GUIDEWIRE PRESSURE X 175 (WIRE) ×1 IMPLANT
INQWIRE 1.5J .035X260CM (WIRE) ×2
KIT ESSENTIALS PG (KITS) ×1 IMPLANT
KIT HEART LEFT (KITS) ×2 IMPLANT
PACK CARDIAC CATHETERIZATION (CUSTOM PROCEDURE TRAY) ×2 IMPLANT
TRANSDUCER W/STOPCOCK (MISCELLANEOUS) ×2 IMPLANT
TUBING CIL FLEX 10 FLL-RA (TUBING) ×2 IMPLANT
WIRE HI TORQ VERSACORE-J 145CM (WIRE) ×1 IMPLANT

## 2022-06-06 NOTE — Progress Notes (Signed)
ANTICOAGULATION CONSULT NOTE - Follow Up  Pharmacy Consult for Heparin  Indication: chest pain/ACS  Allergies  Allergen Reactions   Macrobid [Nitrofurantoin Monohyd Macro] Nausea And Vomiting   Nitrofurantoin Nausea And Vomiting   Sulfamethoxazole-Trimethoprim     unknown   HEPARIN DW (KG): 61.3   Vital Signs: Temp: 97.8 F (36.6 C) (07/31 0437) Temp Source: Oral (07/31 0437) BP: 141/77 (07/31 0437) Pulse Rate: 58 (07/31 0437)  Labs: Recent Labs    06/03/22 2137 06/03/22 2340 06/04/22 0222 06/04/22 0724 06/04/22 1559 06/05/22 0309 06/05/22 1209 06/06/22 0430  HGB 12.8  --   --   --   --  12.4  --  12.9  HCT 38.7  --   --   --   --  37.5  --  37.6  PLT 258  --   --   --   --  240  --  208  HEPARINUNFRC  --   --   --  0.39   < > 0.40 0.35 0.45  CREATININE 0.60  --   --   --   --  0.61  --   --   TROPONINIHS 334* 380* 436* 528*  --   --   --   --    < > = values in this interval not displayed.     Estimated Creatinine Clearance: 65.6 mL/min (by C-G formula based on SCr of 0.61 mg/dL).   Medical History: Past Medical History:  Diagnosis Date   Anxiety    Depression    Elevated cholesterol    Genital warts 1976   pt was treated with dyphylin   Menopause syndrome    Vitamin D deficiency    Vulvar lesion      Assessment: 66 y/o F to start heparin for CP/elevated troponin. Renal function at baseline. No anticoagulation PTA. No history of significant bleed events. Pharmacy consulted for heparin consult.    Patient's heparin level came back 0.45 therapeutic on heparin drip rate 900 units/hr this am prior to cath. Hemoglobin stable in 12s and plt 200s. No s/sx bleeding or issues running the heparin drip.   Goal of Therapy:  Heparin level 0.3-0.7 units/ml Monitor platelets by anticoagulation protocol: Yes   Plan:  Heparin currently off for cath  Follow up plans post cath  Erin Hearing PharmD., BCPS Clinical Pharmacist 06/06/2022 7:50 AM

## 2022-06-06 NOTE — Progress Notes (Signed)
CARDIAC REHAB PHASE I   MI education completed with pt. Pt given MI book, heart healthy diet, and smoking cessation tip sheet. Reviewed site care, restrictions, and exercise guidelines. Will refer to CRP II [place].  [id number] Rufina Falco, RN BSN 06/06/2022 11:06 AM

## 2022-06-06 NOTE — Discharge Summary (Addendum)
Discharge Summary    Patient ID: Colleen Mcneil MRN: [id number]; DOB: Jul 20, 1956  Admit date: 06/03/2022 Discharge date: 06/06/2022  PCP:  Colleen Maid, MD   [facility] Cardiologist:  Colleen Burow, MD    Discharge Diagnoses    Principal Problem:   NSTEMI (non-ST elevated myocardial infarction) [facility]) Active Problems:   Dyslipidemia   Tobacco use   Diagnostic Studies/Procedures    Echo: 06/05/22  IMPRESSIONS     1. Left ventricular ejection fraction, by estimation, is 60 to 65%. The  left ventricle has normal function. The left ventricle has no regional  wall motion abnormalities. Left ventricular diastolic parameters were  normal.   2. Right ventricular systolic function is normal. The right ventricular  size is normal. There is normal pulmonary artery systolic pressure.   3. The mitral valve is normal in structure. No evidence of mitral valve  regurgitation. No evidence of mitral stenosis.   4. The aortic valve is tricuspid. There is mild calcification of the  aortic valve. There is mild thickening of the aortic valve. Aortic valve  regurgitation is not visualized. Aortic valve sclerosis/calcification is  present, without any evidence of  aortic stenosis.   5. The inferior vena cava is normal in size with greater than 50%  respiratory variability, suggesting right atrial pressure of 3 mmHg.   FINDINGS   Left Ventricle: Left ventricular ejection fraction, by estimation, is 60  to 65%. The left ventricle has normal function. The left ventricle has no  regional wall motion abnormalities. The left ventricular internal cavity  size was normal in size. There is   no left ventricular hypertrophy. Left ventricular diastolic parameters  were normal.   Right Ventricle: The right ventricular size is normal. No increase in  right ventricular wall thickness. Right ventricular systolic function is  normal. There is normal pulmonary artery systolic pressure.  The tricuspid  regurgitant velocity is 1.89 m/s, and   with an assumed right atrial pressure of 3 mmHg, the estimated right  ventricular systolic pressure is 36.1 mmHg.   Left Atrium: Left atrial size was normal in size.   Right Atrium: Right atrial size was normal in size.   Pericardium: There is no evidence of pericardial effusion.   Mitral Valve: The mitral valve is normal in structure. Mild to moderate  mitral annular calcification. No evidence of mitral valve regurgitation.  No evidence of mitral valve stenosis.   Tricuspid Valve: The tricuspid valve is normal in structure. Tricuspid  valve regurgitation is trivial. No evidence of tricuspid stenosis.   Aortic Valve: The aortic valve is tricuspid. There is mild calcification  of the aortic valve. There is mild thickening of the aortic valve. Aortic  valve regurgitation is not visualized. Aortic valve  sclerosis/calcification is present, without any  evidence of aortic stenosis.   Pulmonic Valve: The pulmonic valve was normal in structure. Pulmonic valve  regurgitation is not visualized. No evidence of pulmonic stenosis.   Aorta: The aortic root is normal in size and structure.   Venous: The inferior vena cava is normal in size with greater than 50%  respiratory variability, suggesting right atrial pressure of 3 mmHg.   IAS/Shunts: No atrial level shunt detected by color flow Doppler.   Cath: 06/06/22  IMPRESSION: Colleen Mcneil was admitted with a non-STEMI.  She has a moderate proximal LAD stenosis with a DFR of 0.95 suggesting this was not physiologically significant.  We will pursue medical therapy with risk factor modification.  I talked  to her about smoking cessation.  I suspect she would benefit from being on a PCSK9.  Barring any other medical issues that need to be addressed she is potentially a candidate for discharge later today.   Colleen Mcneil. MD, [facility] 06/06/2022 8:43 AM  Diagnostic Dominance:  Left  _____________   History of Present Illness     Colleen Mcneil is a 66 y.o. female with PMH of depression, HLD and anxiety who presented with chest pain. day). Patient reported significant stressful event at work 06/02/22.  On the evening of the same-day patient developed chest pain prompting emergency room visit.  At that time troponins flat, EKG was unremarkable ACS was ruled out.  Patient went home she was able to fall asleep following morning she woke up this still having chest pain and reports feeling like "she is out of gas".  Ongoing chest pain without any particular exacerbating or relieving factors.  No correlation with exertion.  Patient has any palpitations, presyncope or syncope.  Patient has any heart failure symptoms.  Patient had not had any fevers, chills, nausea, vomiting, diarrhea.  Patient had not had any recent sick contacts. While in emergency room patient was hemodynamically stable, distress, was normal sinus rhythm with no significant ST changes initial troponin was 334 with follow-up of 380.  Hospital Course     NSTEMI: High-sensitivity troponin 436>> 528.  Underwent cardiac catheterization noted above with moderate proximal LAD stenosis with DFR of 0.95 suggesting not physiologically significant.  It was recommended for aggressive risk management.  Placed on aspirin, Imdur and statin therapy.  Echocardiogram showed LVEF of 60 to 65% with no regional wall motion abnormality, normal RV size and function with normal pulmonary artery pressure.  No significant valvular disease.  No recurrent chest pain.  Seen by cardiac rehab.  Was initially started on low-dose metoprolol but heart rate dropped into the 40s as well as developed soft blood pressures.  Therefore this was stopped prior to discharge --Continue aspirin, Imdur and Crestor 40 mg daily  Hyperlipidemia: LDL 142, HDL 46, LP(a) 193 -- Started on Crestor 40 mg daily --Check LFTs/FLP in 8 weeks  Tobacco use:  Cessation advised  General: Well developed, well nourished, female appearing in no acute distress. Head: Normocephalic, atraumatic.  Neck: Supple without bruits, JVD. Lungs:  Resp regular and unlabored, CTA. Heart: RRR, S1, S2, no S3, S4, or murmur; no rub. Abdomen: Soft, non-tender, non-distended with normoactive bowel sounds. No hepatomegaly. No rebound/guarding. No obvious abdominal masses. Extremities: No clubbing, cyanosis, edema. Distal pedal pulses are 2+ bilaterally. Right cath site stable without bruising or hematoma Neuro: Alert and oriented X 3. Moves all extremities spontaneously. Psych: Normal affect.  Patient was seen by Dr. Ali Lowe and deemed stable for discharge home.  Medication sent to [facility].  Follow-up arranged in the office.  Educated by [employer].D. prior to discharge  The patient will be scheduled for a TOC follow up appointment in 10-14 days.  A message has been sent to the [facility] and Scheduling Pool at the office where the patient should be seen for follow up.  _____________  Discharge Vitals Blood pressure 101/61, pulse (!) 49, temperature 97.8 F (36.6 C), temperature source Oral, resp. rate 11, height '5\' 6"'$  (1.676 m), weight 61.3 kg, SpO2 97 %.  Filed Weights   06/04/22 0630  Weight: 61.3 kg    Labs & Radiologic Studies    CBC Recent Labs    06/05/22 0309 06/06/22 0430  WBC 8.2  7.3  HGB 12.4 12.9  HCT 37.5 37.6  MCV 92.6 90.8  PLT 240 244   Basic Metabolic Panel Recent Labs    06/03/22 2137 06/05/22 0309  NA 141 138  K 3.8 4.1  CL 109 108  CO2 27 25  GLUCOSE 115* 111*  BUN 11 15  CREATININE 0.60 0.61  CALCIUM 9.0 8.8*  MG  --  1.9   Liver Function Tests No results for input(s): "AST", "ALT", "ALKPHOS", "BILITOT", "PROT", "ALBUMIN" in the last 72 hours. No results for input(s): "LIPASE", "AMYLASE" in the last 72 hours. High Sensitivity Troponin:   Recent Labs  Lab 06/02/22 2231 06/03/22 2137 06/03/22 2340 06/04/22 0222  06/04/22 0724  TROPONINIHS 4 334* 380* 436* 528*    BNP Invalid input(s): "POCBNP" D-Dimer No results for input(s): "DDIMER" in the last 72 hours. Hemoglobin A1C Recent Labs    06/04/22 0222  HGBA1C 5.8*   Fasting Lipid Panel Recent Labs    06/04/22 0222  CHOL 200  HDL 46  LDLCALC 142*  TRIG 62  CHOLHDL 4.3   Thyroid Function Tests No results for input(s): "TSH", "T4TOTAL", "T3FREE", "THYROIDAB" in the last 72 hours.  Invalid input(s): "FREET3" _____________  CARDIAC CATHETERIZATION  Result Date: 06/06/2022 Images from the original result were not included.   Ost LAD to Prox LAD lesion is 60% stenosed.   Prox LAD lesion is 50% stenosed.   Mid LAD lesion is 40% stenosed. Colleen Mcneil is a 66 y.o. female  [id number] LOCATION:  FACILITY: [place] PHYSICIAN: Colleen Mcneil, M.D. [birth year] DATE OF PROCEDURE:  06/06/2022 DATE OF DISCHARGE: CARDIAC CATHETERIZATION History obtained from chart review.66 y.o. female who presented to the hospital, found to have elevated troponins consistent with non-STEMI.  She has a history of tobacco abuse 1/2 pack/day, hyperlipidemia on statin therapy with a LDL of 140 as well as family history.  She presented with chest pain.  Troponins went up to approximately 600.  Her LV function was normal by 2D echocardiogram and her EKG shows no acute changes. PROCEDURE DESCRIPTION: The patient was brought to the second floor [facility] cath lab in the postabsorptive state.  She was premedicated with IV Versed and fentanyl.  Her right wrist was prepped and shaved in usual sterile fashion. Xylocaine 1% was used for local anesthesia. A 6 French sheath was inserted into the right radial artery using standard Seldinger technique. The patient received 3000 units  of heparin intravenously.  A 5 [place] TIG catheter was used for selective coronary angiography and obtain left heart pressures.  Isovue dye is used for the entirety of the case (110 cc total contrast the  patient).  Retrograde aortic, left ventricular output blood pressures were recorded.  (LVEDP equaled 4).  Radial cocktail was administered via the SideArm sheath. Ms. Dunne had a moderate proximal LAD stenosis.  I elected to perform physiologic testing using DFR.  She received a total of 7000's of heparin with an ACT of over 275.  Using an XB LAD 3.0 cm guide catheter and an Abbott DFR wire I was able to cross the lesion demonstrating a DFR of 0.95 suggesting that the lesion was not physiologically significant.  The wire was removed as was the guide catheter.  The sheath was removed and a Zephyr band was placed on the right wrist to achieve patent hemostasis.  The patient left lab in stable condition.   Mr. Reimann was admitted with a non-STEMI.  She has a moderate proximal LAD stenosis  with a DFR of 0.95 suggesting this was not physiologically significant.  We will pursue medical therapy with risk factor modification.  I talked to her about smoking cessation.  I suspect she would benefit from being on a PCSK9.  Barring any other medical issues that need to be addressed she is potentially a candidate for discharge later today. Colleen Mcneil. MD, [facility] 06/06/2022 8:43 AM    ECHOCARDIOGRAM COMPLETE  Result Date: 06/05/2022    ECHOCARDIOGRAM REPORT   Patient Name:   NAKAYLA RORABAUGH Date of Exam: 06/05/2022 Medical Rec #:  [id number]         Height:       66.0 in Accession #:    [id number]        Weight:       135.1 lb Date of Birth:  1956-06-12         BSA:          1.693 m Patient Age:    42 years          BP:           121/86 mmHg Patient Gender: F                 HR:           49 bpm. Exam Location:  Inpatient Procedure: 2D Echo, Color Doppler and Cardiac Doppler Indications:    NSTEMI  History:        Patient has no prior history of Echocardiogram examinations.                 Risk Factors:Dyslipidemia.  Sonographer:    Raquel Sarna Senior RDCS Referring Phys: [id number] [place]  1. Left  ventricular ejection fraction, by estimation, is 60 to 65%. The left ventricle has normal function. The left ventricle has no regional wall motion abnormalities. Left ventricular diastolic parameters were normal.  2. Right ventricular systolic function is normal. The right ventricular size is normal. There is normal pulmonary artery systolic pressure.  3. The mitral valve is normal in structure. No evidence of mitral valve regurgitation. No evidence of mitral stenosis.  4. The aortic valve is tricuspid. There is mild calcification of the aortic valve. There is mild thickening of the aortic valve. Aortic valve regurgitation is not visualized. Aortic valve sclerosis/calcification is present, without any evidence of aortic stenosis.  5. The inferior vena cava is normal in size with greater than 50% respiratory variability, suggesting right atrial pressure of 3 mmHg. FINDINGS  Left Ventricle: Left ventricular ejection fraction, by estimation, is 60 to 65%. The left ventricle has normal function. The left ventricle has no regional wall motion abnormalities. The left ventricular internal cavity size was normal in size. There is  no left ventricular hypertrophy. Left ventricular diastolic parameters were normal. Right Ventricle: The right ventricular size is normal. No increase in right ventricular wall thickness. Right ventricular systolic function is normal. There is normal pulmonary artery systolic pressure. The tricuspid regurgitant velocity is 1.89 m/s, and  with an assumed right atrial pressure of 3 mmHg, the estimated right ventricular systolic pressure is 09.3 mmHg. Left Atrium: Left atrial size was normal in size. Right Atrium: Right atrial size was normal in size. Pericardium: There is no evidence of pericardial effusion. Mitral Valve: The mitral valve is normal in structure. Mild to moderate mitral annular calcification. No evidence of mitral valve regurgitation. No evidence of mitral valve stenosis. Tricuspid  Valve: The tricuspid valve is normal in structure. Tricuspid valve  regurgitation is trivial. No evidence of tricuspid stenosis. Aortic Valve: The aortic valve is tricuspid. There is mild calcification of the aortic valve. There is mild thickening of the aortic valve. Aortic valve regurgitation is not visualized. Aortic valve sclerosis/calcification is present, without any evidence of aortic stenosis. Pulmonic Valve: The pulmonic valve was normal in structure. Pulmonic valve regurgitation is not visualized. No evidence of pulmonic stenosis. Aorta: The aortic root is normal in size and structure. Venous: The inferior vena cava is normal in size with greater than 50% respiratory variability, suggesting right atrial pressure of 3 mmHg. IAS/Shunts: No atrial level shunt detected by color flow Doppler.  LEFT VENTRICLE PLAX 2D LVIDd:         4.80 cm   Diastology LVIDs:         2.70 cm   LV e' medial:    7.51 cm/s LV PW:         1.00 cm   LV E/e' medial:  8.7 LV IVS:        0.90 cm   LV e' lateral:   8.16 cm/s LVOT diam:     2.00 cm   LV E/e' lateral: 8.0 LV SV:         82 LV SV Index:   48 LVOT Area:     3.14 cm  RIGHT VENTRICLE RV S prime:     14.00 cm/s TAPSE (M-mode): 2.3 cm LEFT ATRIUM             Index        RIGHT ATRIUM           Index LA diam:        3.60 cm 2.13 cm/m   RA Area:     20.50 cm LA Vol (A2C):   43.2 ml 25.52 ml/m  RA Volume:   64.40 ml  38.05 ml/m LA Vol (A4C):   36.9 ml 21.80 ml/m LA Biplane Vol: 44.0 ml 25.99 ml/m  AORTIC VALVE LVOT Vmax:   111.00 cm/s LVOT Vmean:  76.500 cm/s LVOT VTI:    0.261 m  AORTA Ao Root diam: 3.30 cm MITRAL VALVE               TRICUSPID VALVE MV Area (PHT): 2.87 cm    TR Peak grad:   14.3 mmHg MV Decel Time: 264 msec    TR Vmax:        189.00 cm/s MV E velocity: 65.60 cm/s MV A velocity: 62.20 cm/s  SHUNTS MV E/A ratio:  1.05        Systemic VTI:  0.26 m                            Systemic Diam: 2.00 cm Dani Gobble Croitoru MD Electronically signed by Sanda Klein MD  Signature Date/Time: 06/05/2022/3:12:07 PM    Final    DG Chest 2 View  Result Date: 06/03/2022 CLINICAL DATA:  Chest pain EXAM: CHEST - 2 VIEW COMPARISON:  06/02/2022 FINDINGS: Lungs are clear.  No pleural effusion or pneumothorax. The heart is normal in size. Mild degenerative changes of the visualized thoracolumbar spine. IMPRESSION: Normal chest radiographs. Electronically Signed   By: Julian Hy M.D.   On: 06/03/2022 22:02   CT Angio Chest PE W and/or Wo Contrast  Result Date: 06/02/2022 CLINICAL DATA:  Severe chest pain EXAM: CT ANGIOGRAPHY CHEST WITH CONTRAST TECHNIQUE: Multidetector CT imaging of the chest was performed using the standard protocol  during bolus administration of intravenous contrast. Multiplanar CT image reconstructions and MIPs were obtained to evaluate the vascular anatomy. RADIATION DOSE REDUCTION: This exam was performed according to the departmental dose-optimization program which includes automated exposure control, adjustment of the mA and/or kV according to patient size and/or use of iterative reconstruction technique. CONTRAST:  98m OMNIPAQUE IOHEXOL 350 MG/ML SOLN COMPARISON:  Chest x-ray 06/02/2022 FINDINGS: Cardiovascular: Satisfactory opacification of the pulmonary arteries to the segmental level. No evidence of pulmonary embolism. Normal heart size. No pericardial effusion. Mild aortic atherosclerosis. No aneurysm. Coronary vascular calcification. Mediastinum/Nodes: No enlarged mediastinal, hilar, or axillary lymph nodes. Thyroid gland, trachea, and esophagus demonstrate no significant findings. Lungs/Pleura: Scarring or atelectasis at the right middle lobe. No acute consolidation, pleural effusion or pneumothorax Upper Abdomen: No acute abnormality. Musculoskeletal: No chest wall abnormality. No acute or significant osseous findings. Review of the MIP images confirms the above findings. IMPRESSION: 1. Negative for acute pulmonary embolus. 2. Clear lung fields  Aortic Atherosclerosis (ICD10-I70.0). Electronically Signed   By: KDonavan FoilM.D.   On: 06/02/2022 23:24   DG Chest 2 View  Result Date: 06/02/2022 CLINICAL DATA:  Chest pain EXAM: CHEST - 2 VIEW COMPARISON:  10/21/2020 FINDINGS: The heart size and mediastinal contours are within normal limits. Aortic atherosclerosis. Both lungs are clear. Mild scoliosis IMPRESSION: No active cardiopulmonary disease. Electronically Signed   By: KDonavan FoilM.D.   On: 06/02/2022 20:55    Disposition   Pt is being discharged home today in good condition.  Follow-up Plans & Appointments     Follow-up Information     MLenna Sciara NP Follow up on 06/16/2022.   Specialties: Nurse Practitioner, Family Medicine Why: at 10am for your follow up appt with Dr. BRachel Bo NP EMaris Bergerinformation: [address] [place] [id number]               Discharge Instructions     Amb Referral to Cardiac Rehabilitation   Complete by: As directed    [facility]  Diagnosis: NSTEMI   After initial evaluation and assessments completed: Virtual Based Care may be provided alone or in conjunction with Phase 2 Cardiac Rehab based on patient barriers.: Yes   Call MD for:  difficulty breathing, headache or visual disturbances   Complete by: As directed    Call MD for:  persistant dizziness or light-headedness   Complete by: As directed    Call MD for:  redness, tenderness, or signs of infection (pain, swelling, redness, odor or green/yellow discharge around incision site)   Complete by: As directed    Diet - low sodium heart healthy   Complete by: As directed    Discharge instructions   Complete by: As directed    Radial Site Care Refer to this sheet in the next few weeks. These instructions provide you with information on caring for yourself after your procedure. Your caregiver may also give you more specific instructions. Your treatment  has been planned according to current medical practices, but problems sometimes occur. Call your caregiver if you have any problems or questions after your procedure. HOME CARE INSTRUCTIONS You may shower the day after the procedure. Remove the bandage (dressing) and gently wash the site with plain soap and water. Gently pat the site dry.  Do not apply powder or lotion to the site.  Do not submerge the affected site in water for 3 to 5 days.  Inspect the site at least twice daily.  Do not flex or bend the affected arm for 24 hours.  No lifting over 5 pounds (2.3 kg) for 5 days after your procedure.  Do not drive home if you are discharged the same day of the procedure. Have someone else drive you.  You may drive 24 hours after the procedure unless otherwise instructed by your caregiver.  What to expect: Any bruising will usually fade within 1 to 2 weeks.  Blood that collects in the tissue (hematoma) may be painful to the touch. It should usually decrease in size and tenderness within 1 to 2 weeks.  SEEK IMMEDIATE MEDICAL CARE IF: You have unusual pain at the radial site.  You have redness, warmth, swelling, or pain at the radial site.  You have drainage (other than a small amount of blood on the dressing).  You have chills.  You have a fever or persistent symptoms for more than 72 hours.  You have a fever and your symptoms suddenly get worse.  Your arm becomes pale, cool, tingly, or numb.  You have heavy bleeding from the site. Hold pressure on the site.   Increase activity slowly   Complete by: As directed        Discharge Medications   Allergies as of 06/06/2022       Reactions   Macrobid [nitrofurantoin Monohyd Macro] Nausea And Vomiting   Nitrofurantoin Nausea And Vomiting   Sulfamethoxazole-trimethoprim    unknown        Medication List     TAKE these medications    acetaminophen 325 MG tablet Commonly known as: TYLENOL Take 325 mg by mouth every 4 (four) hours as  needed for mild pain.   aspirin EC 81 MG tablet Take 81 mg by mouth every 4 (four) hours as needed for mild pain. Swallow whole.   clonazePAM 1 MG tablet Commonly known as: KLONOPIN TAKE 1 TABLET DAILY AS NEEDED FOR ANXIETY What changed:  how much to take how to take this when to take this reasons to take this additional instructions   FLUoxetine 10 MG capsule Commonly known as: PROZAC Take 1 capsule (10 mg total) by mouth daily. TAKE (1) CAPSULES BY MOUTH ONCE DAILY. What changed: additional instructions   isosorbide mononitrate 30 MG 24 hr tablet Commonly known as: IMDUR Take 1 tablet (30 mg total) by mouth daily.   nitroGLYCERIN 0.4 MG SL tablet Commonly known as: NITROSTAT Place 1 tablet (0.4 mg total) under the tongue every 5 (five) minutes as needed for chest pain.   rosuvastatin 40 MG tablet Commonly known as: CRESTOR Take 1 tablet (40 mg total) by mouth daily. Start taking on: June 07, 2022   Vitamin D3 125 MCG (5000 UT) Caps Take 1 capsule (5,000 Units total) by mouth daily.         Outstanding Labs/Studies   FLP/LFTs in 8 weeks   Duration of Discharge Encounter   Greater than 30 minutes including physician time.  Signed, Reino Bellis, NP 06/06/2022, 1:02 PM  ATTENDING ATTESTATION:  After conducting a review of all available clinical information with the care team, interviewing the patient, and performing a physical exam, I agree with the findings and plan described in this note.   GEN: No acute distress.   HEENT:  MMM, no JVD, no scleral icterus Cardiac: RRR, no murmurs, rubs, or gallops.  Respiratory: Clear to auscultation bilaterally. GI: Soft, nontender, non-distended  MS: No edema; No deformity. Neuro:  Nonfocal  Vasc:  R TR band  Patient doing well after coronary angiography with FFR negative moderate LAD disease.  On review of her images, she may harbor some degree of microvascular dysfunction.  Will start low dose Imdur at bedtime  to see if this benefits her symptoms.  Discharge today with cardiology follow up.  Lenna Sciara, MD Pager [phone]

## 2022-06-06 NOTE — Interval H&P Note (Signed)
Cath Lab Visit (complete for each Cath Lab visit)  Clinical Evaluation Leading to the Procedure:   ACS: Yes.    Non-ACS:    Anginal Classification: CCS II  Anti-ischemic medical therapy: Minimal Therapy (1 class of medications)  Non-Invasive Test Results: No non-invasive testing performed  Prior CABG: No previous CABG      History and Physical Interval Note:  06/06/2022 7:41 AM  Colleen Mcneil  has presented today for surgery, with the diagnosis of NSTEMI.  The various methods of treatment have been discussed with the patient and family. After consideration of risks, benefits and other options for treatment, the patient has consented to  Procedure(s): LEFT HEART CATH AND CORONARY ANGIOGRAPHY (N/A) as a surgical intervention.  The patient's history has been reviewed, patient examined, no change in status, stable for surgery.  I have reviewed the patient's chart and labs.  Questions were answered to the patient's satisfaction.     Quay Burow

## 2022-06-07 LAB — POCT ACTIVATED CLOTTING TIME: Activated Clotting Time: 281 seconds

## 2022-06-16 ENCOUNTER — Other Ambulatory Visit: Payer: Self-pay

## 2022-06-16 ENCOUNTER — Ambulatory Visit: Payer: 59 | Admitting: Nurse Practitioner

## 2022-06-16 ENCOUNTER — Encounter: Payer: Self-pay | Admitting: Nurse Practitioner

## 2022-06-16 VITALS — BP 98/62 | HR 56 | Ht 66.0 in | Wt 136.6 lb

## 2022-06-16 DIAGNOSIS — I25118 Atherosclerotic heart disease of native coronary artery with other forms of angina pectoris: Secondary | ICD-10-CM | POA: Diagnosis not present

## 2022-06-16 DIAGNOSIS — I252 Old myocardial infarction: Secondary | ICD-10-CM | POA: Diagnosis not present

## 2022-06-16 DIAGNOSIS — E785 Hyperlipidemia, unspecified: Secondary | ICD-10-CM

## 2022-06-16 DIAGNOSIS — Z720 Tobacco use: Secondary | ICD-10-CM | POA: Diagnosis not present

## 2022-06-16 DIAGNOSIS — I2510 Atherosclerotic heart disease of native coronary artery without angina pectoris: Secondary | ICD-10-CM

## 2022-06-16 NOTE — Patient Instructions (Addendum)
Medication Instructions:  Decrease Imdur 15 mg daily (half tablet)  *If you need a refill on your cardiac medications before your next appointment, please call your pharmacy*   Lab Work: Your physician recommends that you return for lab work in 4 weeks Fasting lipid panel LFTs   If you have labs (blood work) drawn today and your tests are completely normal, you will receive your results only by: [place] (if you have MyChart) OR A paper copy in the mail If you have any lab test that is abnormal or we need to change your treatment, we will call you to review the results.   Testing/Procedures: NONE ordered at this time of appointment     Follow-Up: At [facility], you and your health needs are our priority.  As part of our continuing mission to provide you with exceptional heart care, we have created designated Provider Care Teams.  These Care Teams include your primary Cardiologist (physician) and Advanced Practice Providers (APPs -  Physician Assistants and Nurse Practitioners) who all work together to provide you with the care you need, when you need it.  We recommend signing up for the patient portal called "MyChart".  Sign up information is provided on this After Visit Summary.  MyChart is used to connect with patients for Virtual Visits (Telemedicine).  Patients are able to view lab/test results, encounter notes, upcoming appointments, etc.  Non-urgent messages can be sent to your provider as well.   To learn more about what you can do with MyChart, go to [email or web address].    Your next appointment:   4 month(s)  The format for your next appointment:   In Person  Provider:   Quay Burow, MD     Other Instructions   Important Information About Sugar

## 2022-06-16 NOTE — Progress Notes (Signed)
Office Visit    Patient Name: Colleen Mcneil Date of Encounter: 06/16/2022  Primary Care Provider:  Rubie Maid, MD Primary Cardiologist:  Quay Burow, MD  Chief Complaint    66 year old female with a history of CAD, hyperlipidemia, and tobacco use who presents for posthospital follow-up related to CAD s/p NSTEMI.  Past Medical History    Past Medical History:  Diagnosis Date   Anxiety    Depression    Elevated cholesterol    Genital warts 1976   pt was treated with dyphylin   Menopause syndrome    Vitamin D deficiency    Vulvar lesion    Past Surgical History:  Procedure Laterality Date   CESAREAN SECTION  1986   COLONOSCOPY WITH PROPOFOL N/A 05/20/2016   Procedure: COLONOSCOPY WITH PROPOFOL;  Surgeon: Lucilla Lame, MD;  Location: [place];  Service: Endoscopy;  Laterality: N/A;   HERNIA REPAIR     INTRAVASCULAR PRESSURE WIRE/FFR STUDY N/A 06/06/2022   Procedure: INTRAVASCULAR PRESSURE WIRE/FFR STUDY;  Surgeon: Lorretta Harp, MD;  Location: [place] [facility];  Service: Cardiovascular;  Laterality: N/A;   LEFT HEART CATH AND CORONARY ANGIOGRAPHY N/A 06/06/2022   Procedure: LEFT HEART CATH AND CORONARY ANGIOGRAPHY;  Surgeon: Lorretta Harp, MD;  Location: [place] [facility];  Service: Cardiovascular;  Laterality: N/A;   POLYPECTOMY  05/20/2016   Procedure: POLYPECTOMY;  Surgeon: Lucilla Lame, MD;  Location: [facility];  Service: Endoscopy;;   TUBAL LIGATION      Allergies  Allergies  Allergen Reactions   Macrobid [Nitrofurantoin Monohyd Macro] Nausea And Vomiting   Nitrofurantoin Nausea And Vomiting   Sulfamethoxazole-Trimethoprim     unknown    History of Present Illness    66 year old female with the above past medical history including CAD, hyperlipidemia, and tobacco use.  She presented to the ED on 06/02/2022 with complaints of chest pain following a stressful event at work.  At that time troponins were flat, EKG 1 was  unremarkable, and ACS was ruled out.  She was discharged home and fell asleep.  The following morning she woke up with ongoing chest pain and returned to the ED on 06/03/2022.  Troponins were elevated.  Cardiology was consulted.  She was hospitalized from 06/03/2022 to 06/06/2022 in the setting of NSTEMI.  She underwent cardiac catheterization which revealed moderate proximal LAD stenosis with DFR of 0.95 suggesting no physiological significance.  Aggressive secondary prevention and risk management was recommended.  She was started on aspirin, Imdur, and statin therapy.  Echocardiogram showed EF 60 to 65%, no RWMA, normal RV function, normal PASP, no significant valvular disease.  She was also initially started on low-dose metoprolol, however, her heart rate dropped into the 40s and she was hypotensive, therefore, metoprolol was discontinued prior to discharge.  She was discharged home in stable condition on 06/06/2022.  She presents today for follow-up.  Since her hospitalization she has been stable from a back standpoint.  She did have 2 episodes of chest discomfort which she describes as an aching.  Her symptoms occurred at rest and lasted for approximately 3 to 4 minutes and resolve spontaneously.  She continues to smoke approximately 4 cigarettes daily.  She has noted intermittent lightheadedness, BP is borderline in office today.  She states she had a significant headache upon initiation of Imdur, however this seems to have improved.  She notes that prior to her heart attack she had bilateral knee/calf pain however, since her heart attack this  has resolved.  Additionally, she notes she has been under significant amounts of personal stress-her husband died in November 08, 2021, and she has had a personal stress at home as well as at her workplace.  Other than her isolated episodes of chest discomfort, she reports feeling well and denies any additional concerns today.  Home Medications    Current Outpatient  Medications  Medication Sig Dispense Refill   acetaminophen (TYLENOL) 325 MG tablet Take 325 mg by mouth every 4 (four) hours as needed for mild pain.     aspirin EC 81 MG tablet Take 81 mg by mouth every 4 (four) hours as needed for mild pain. Swallow whole.     clonazePAM (KLONOPIN) 1 MG tablet TAKE 1 TABLET DAILY AS NEEDED FOR ANXIETY (Patient taking differently: Take 0.5 mg by mouth daily as needed for anxiety.) 90 tablet 2   FLUoxetine (PROZAC) 10 MG capsule Take 1 capsule (10 mg total) by mouth daily. TAKE (1) CAPSULES BY MOUTH ONCE DAILY. (Patient taking differently: Take 10 mg by mouth daily.) 90 capsule 3   isosorbide mononitrate (IMDUR) 30 MG 24 hr tablet Take 1 tablet (30 mg total) by mouth daily. 90 tablet 1   nitroGLYCERIN (NITROSTAT) 0.4 MG SL tablet Place 1 tablet (0.4 mg total) under the tongue every 5 (five) minutes as needed for chest pain. 25 tablet 2   rosuvastatin (CRESTOR) 40 MG tablet Take 1 tablet (40 mg total) by mouth daily. 90 tablet 1   No current facility-administered medications for this visit.     Review of Systems    She denies chest pain, palpitations, dyspnea, pnd, orthopnea, n, v, dizziness, syncope, edema, weight gain, or early satiety. All other systems reviewed and are otherwise negative except as noted above.    Physical Exam    VS:  BP 98/62   Pulse (!) 56   Ht '5\' 6"'$  (1.676 m)   Wt 136 lb 9.6 oz (62 kg)   SpO2 100%   BMI 22.05 kg/m   GEN: Well nourished, well developed, in no acute distress. HEENT: normal. Neck: Supple, no JVD, carotid bruits, or masses. Cardiac: RRR, no murmurs, rubs, or gallops. No clubbing, cyanosis, edema.  Radials/DP/PT 2+ and equal bilaterally.  Respiratory:  Respirations regular and unlabored, clear to auscultation bilaterally. GI: Soft, nontender, nondistended, BS + x 4. MS: no deformity or atrophy. Skin: warm and dry, no rash. Neuro:  Strength and sensation are intact. Psych: Normal affect. BP Readings from Last 3  Encounters:  06/16/22 98/62  06/06/22 (!) 102/55  06/02/22 136/67   Accessory Clinical Findings    ECG personally reviewed by me today -sinus bradycardia, 56 bpm, low voltage- no acute changes.  Lab Results  Component Value Date   WBC 7.3 06/06/2022   HGB 12.9 06/06/2022   HCT 37.6 06/06/2022   MCV 90.8 06/06/2022   PLT 208 06/06/2022   Lab Results  Component Value Date   CREATININE 0.61 06/05/2022   BUN 15 06/05/2022   NA 138 06/05/2022   K 4.1 06/05/2022   CL 108 06/05/2022   CO2 25 06/05/2022   Lab Results  Component Value Date   ALT 11 06/02/2022   AST 16 06/02/2022   ALKPHOS 45 06/02/2022   BILITOT 0.4 06/02/2022   Lab Results  Component Value Date   CHOL 200 06/04/2022   HDL 46 06/04/2022   LDLCALC 142 (H) 06/04/2022   TRIG 62 06/04/2022   CHOLHDL 4.3 06/04/2022    Lab Results  Component Value Date   HGBA1C 5.8 (H) 06/04/2022    Assessment & Plan    1. CAD/chest pain: S/p recent NSTEMI, cath showed moderate proximal LAD stenosis with DFR of 0.95 suggesting no physiological significance. Echo showed EF 60 to 65%, no RWMA, normal RV function, normal PASP, no significant valvular disease.  Metoprolol was discontinued due to bradycardia, hypotension.  Has had 2 isolated episodes of chest discomfort at rest which she described as an aching, lasting approximately 3 to 4 minutes at a time.  Her symptoms resolved spontaneously.  She denies exertional symptoms concerning for angina.  She has had borderline BP and has noted intermittent lightheadedness. Given overall stable symptoms, will trial decreasing Imdur to 15 mg daily.  Continue to monitor symptoms.  Discussed ED precautions. Continue aspirin, Imdur as above, Crestor.  2. Hyperlipidemia: LDL was 142 on 06/04/2022.  Plan for repeat lipids, LFTs in 4 weeks.  Continue Crestor.  3. Tobacco use: He has cut back and is smoking 4 cigarettes daily. Full cessation advised.   4. Disposition: Follow-up in 4 months.  She  was originally scheduled to see Dr. Rockey Situ in [place] as this is much closer to her home and her family numbers have seen him for cardiology care.  Dr. Gwenlyn Found was assigned to her due to her recent hospitalization/catheterization.  She is interested in transitioning providers from Dr. Gwenlyn Found to Dr. Rockey Situ as this is much more convenient for her.  Will discuss with both providers and if agreeable, will establish follow-up with Dr. Rockey Situ at our [place] office.    Lenna Sciara, NP 06/16/2022, 10:50 AM

## 2022-06-17 ENCOUNTER — Telehealth (HOSPITAL_COMMUNITY): Payer: Self-pay

## 2022-06-17 NOTE — Telephone Encounter (Signed)
Per phase I cardiac rehab, fax cardiac rehab referral to [facility].

## 2022-06-27 ENCOUNTER — Telehealth: Payer: Self-pay

## 2022-06-27 NOTE — Progress Notes (Signed)
Message sent to scheduling pool to schedule 4 month apt.

## 2022-06-27 NOTE — Telephone Encounter (Signed)
Please arrange a 4 mouth appointment for pt with Dr. Rockey Situ ok per Dr. Gwenlyn Found per pts request. Thank you.

## 2022-06-28 NOTE — Telephone Encounter (Signed)
Hi, yes it is.

## 2022-06-28 NOTE — Telephone Encounter (Signed)
Hey!  Is this a provider switch request?

## 2022-06-29 NOTE — Telephone Encounter (Signed)
Dr. Rockey Situ, are you OK with this switch?

## 2022-07-06 ENCOUNTER — Other Ambulatory Visit (HOSPITAL_COMMUNITY): Payer: Self-pay

## 2022-07-14 ENCOUNTER — Ambulatory Visit: Payer: 59 | Admitting: Nurse Practitioner

## 2022-08-05 ENCOUNTER — Ambulatory Visit: Payer: 59 | Admitting: Cardiovascular Disease

## 2022-08-08 ENCOUNTER — Other Ambulatory Visit: Payer: Self-pay

## 2022-08-08 MED ORDER — ROSUVASTATIN CALCIUM 40 MG PO TABS: 40.0000 mg | ORAL_TABLET | Freq: Every day | ORAL | 1 refills | Status: DC

## 2022-08-08 MED ORDER — ISOSORBIDE MONONITRATE ER 30 MG PO TB24
30.0000 mg | ORAL_TABLET | Freq: Every day | ORAL | 1 refills | Status: DC
Start: 2022-08-08 — End: 2022-10-18

## 2022-08-25 ENCOUNTER — Emergency Department (HOSPITAL_COMMUNITY): Payer: 59

## 2022-08-25 ENCOUNTER — Emergency Department (HOSPITAL_COMMUNITY)
Admission: EM | Admit: 2022-08-25 | Discharge: 2022-08-25 | Disposition: A | Discharge status: Home / Self Care | Payer: 59 | Attending: Emergency Medicine | Admitting: Emergency Medicine

## 2022-08-25 ENCOUNTER — Telehealth: Payer: Self-pay

## 2022-08-25 ENCOUNTER — Encounter (HOSPITAL_COMMUNITY): Payer: Self-pay

## 2022-08-25 ENCOUNTER — Other Ambulatory Visit: Payer: Self-pay

## 2022-08-25 DIAGNOSIS — R109 Unspecified abdominal pain: Secondary | ICD-10-CM | POA: Insufficient documentation

## 2022-08-25 LAB — URINALYSIS, ROUTINE W REFLEX MICROSCOPIC
Bilirubin Urine: NEGATIVE
Glucose, UA: NEGATIVE mg/dL
Hgb urine dipstick: NEGATIVE
Ketones, ur: NEGATIVE mg/dL
Leukocytes,Ua: NEGATIVE
Nitrite: NEGATIVE
Protein, ur: NEGATIVE mg/dL
Specific Gravity, Urine: 1.006 (ref 1.005–1.030)
pH: 6 (ref 5.0–8.0)

## 2022-08-25 LAB — CBC WITH DIFFERENTIAL/PLATELET
Abs Immature Granulocytes: 0.01 10*3/uL (ref 0.00–0.07)
Basophils Absolute: 0 10*3/uL (ref 0.0–0.1)
Basophils Relative: 1 %
Eosinophils Absolute: 0.1 10*3/uL (ref 0.0–0.5)
Eosinophils Relative: 2 %
HCT: 35.5 % — ABNORMAL LOW (ref 36.0–46.0)
Hemoglobin: 11.7 g/dL — ABNORMAL LOW (ref 12.0–15.0)
Immature Granulocytes: 0 %
Lymphocytes Relative: 38 %
Lymphs Abs: 2.1 10*3/uL (ref 0.7–4.0)
MCH: 30.5 pg (ref 26.0–34.0)
MCHC: 33 g/dL (ref 30.0–36.0)
MCV: 92.7 fL (ref 80.0–100.0)
Monocytes Absolute: 0.4 10*3/uL (ref 0.1–1.0)
Monocytes Relative: 7 %
Neutro Abs: 3 10*3/uL (ref 1.7–7.7)
Neutrophils Relative %: 52 %
Platelets: 230 10*3/uL (ref 150–400)
RBC: 3.83 MIL/uL — ABNORMAL LOW (ref 3.87–5.11)
RDW: 12.4 % (ref 11.5–15.5)
WBC: 5.6 10*3/uL (ref 4.0–10.5)
nRBC: 0 % (ref 0.0–0.2)

## 2022-08-25 LAB — BASIC METABOLIC PANEL
Anion gap: 6 (ref 5–15)
BUN: 13 mg/dL (ref 8–23)
CO2: 27 mmol/L (ref 22–32)
Calcium: 8.6 mg/dL — ABNORMAL LOW (ref 8.9–10.3)
Chloride: 107 mmol/L (ref 98–111)
Creatinine, Ser: 0.5 mg/dL (ref 0.44–1.00)
GFR, Estimated: >60 mL/min (ref >60)
Glucose, Bld: 83 mg/dL (ref 70–99)
Potassium: 3.8 mmol/L (ref 3.5–5.1)
Sodium: 140 mmol/L (ref 135–145)

## 2022-08-25 LAB — TROPONIN I (HIGH SENSITIVITY): Troponin I (High Sensitivity): 2 ng/L (ref <18)

## 2022-08-25 MED ORDER — OXYCODONE HCL 5 MG PO TABS: 5.0000 mg | ORAL_TABLET | Freq: Once | ORAL | Status: DC

## 2022-08-25 NOTE — Discharge Instructions (Addendum)
You were seen today for left flank pain.  We do not see any evidence of any emergent abnormality.  No nephrolithiasis, kidney infection at this time.  I recommend you follow-up close with your primary care provider within 48 hours for further care and management.  Thank you for the opportunity to participate in your care, Tretha Sciara MD

## 2022-08-25 NOTE — ED Triage Notes (Signed)
Pt presents to ED with complaints of left flank pain x couple weeks. Pt was recently treated with Cipro for UTI.

## 2022-08-25 NOTE — Telephone Encounter (Signed)
Pt calling triage and would like to schedule an appt with Dr Marcelline Mates for pelvic pain. Can someone help schedule her please?

## 2022-08-25 NOTE — ED Provider Notes (Signed)
[facility] EMERGENCY DEPARTMENT Provider Note   CSN: [id number] Arrival date & time: 08/25/22  1312     History Chief Complaint  Patient presents with   Flank Pain    HPI SYAN CULLIMORE is a 66 y.o. female presenting for left-sided flank pain.  She endorses 2 weeks of intermittent left-sided flank pain.  She has a history of nephrolithiasis.  She feels like she is having another kidney stone at this time.  She denies fevers or chills, nausea vomiting, syncope shortness of breath.  She was seen in urgent care approximately 14 days ago diagnosed with urinary tract infection empirically started on Cipro which she has finished without symptomatic improvement.  Patient is otherwise ambulatory tolerating p.o. intake.  Told her PCP about her condition who recommend she come to the emergency department for cross-sectional imaging.  Her pain is currently resolved.  Patient's recorded medical, surgical, social, medication list and allergies were reviewed in the Snapshot window as part of the initial history.   Review of Systems   Review of Systems  Constitutional:  Negative for chills and fever.  HENT:  Negative for ear pain and sore throat.   Eyes:  Negative for pain and visual disturbance.  Respiratory:  Negative for cough and shortness of breath.   Cardiovascular:  Negative for chest pain and palpitations.  Gastrointestinal:  Negative for abdominal pain and vomiting.  Genitourinary:  Negative for dysuria and hematuria.  Musculoskeletal:  Negative for arthralgias and back pain.  Skin:  Negative for color change and rash.  Neurological:  Negative for seizures and syncope.  All other systems reviewed and are negative.   Physical Exam Updated Vital Signs BP 119/74 (BP Location: Left Arm)   Pulse (!) 55   Temp 98.3 F (36.8 C) (Oral)   Resp 16   Ht '5\' 6"'$  (1.676 m)   Wt 63.5 kg   SpO2 97%   BMI 22.60 kg/m  Physical Exam Vitals and nursing note reviewed.  Constitutional:       General: She is not in acute distress.    Appearance: She is well-developed.  HENT:     Head: Normocephalic and atraumatic.  Eyes:     Conjunctiva/sclera: Conjunctivae normal.  Cardiovascular:     Rate and Rhythm: Normal rate and regular rhythm.     Heart sounds: No murmur heard. Pulmonary:     Effort: Pulmonary effort is normal. No respiratory distress.     Breath sounds: Normal breath sounds.  Abdominal:     General: There is no distension.     Palpations: Abdomen is soft.     Tenderness: There is no abdominal tenderness. There is no right CVA tenderness or left CVA tenderness.  Musculoskeletal:        General: No swelling or tenderness. Normal range of motion.     Cervical back: Neck supple.  Skin:    General: Skin is warm and dry.  Neurological:     General: No focal deficit present.     Mental Status: She is alert and oriented to person, place, and time. Mental status is at baseline.     Cranial Nerves: No cranial nerve deficit.      ED Course/ Medical Decision Making/ A&P Clinical Course as of 08/25/22 1725  Thu Aug 25, 2022  1508 Appearance: CLEAR [CR]    Clinical Course User Index [CR] Wilnette Kales, PA    Procedures Procedures   Medications Ordered in ED Medications  oxyCODONE (Oxy IR/ROXICODONE) immediate  release tablet 5 mg (5 mg Oral Not Given 08/25/22 1526)   Medical Decision Making:   ACCALIA RIGDON is a 66 y.o. female who presented to the ED today with left flank pain, detailed above.    Additional history discussed with patient's family/caregivers.  Patient placed on continuous vitals and telemetry monitoring while in ED which was reviewed periodically.  Complete initial physical exam performed, notably the patient  was HDS in NAD.     Reviewed and confirmed nursing documentation for past medical history, family history, social history.    Initial Assessment:   With the patient's presentation of abdominal pain, most likely diagnosis is  nonspecific etiology. Other diagnoses were considered including (but not limited to) gastroenteritis, colitis, small bowel obstruction, appendicitis, cholecystitis, pancreatitis, nephrolithiasis, UTI, pyleonephritis,  PID, ovarian torsion. These are considered less likely due to history of present illness and physical exam findings.   This is most consistent with an acute life/limb threatening illness complicated by underlying chronic conditions.   Initial Plan:  CBC/CMP to evaluate for underlying infectious/metabolic etiology for patient's abdominal pain  Lipase to evaluate for pancreatitis  EKG/Troponin to evaluate for cardiac source of pain  CT Ab/pelvis without contrast due to favored nephrolithiasis over GI etiology for patient's abdominal pain  Urinalysis and repeat physical assessment to evaluate for UTI/Pyelonpehritis  Empiric management of symptoms with escalating pain control and antiemetics as needed.   Initial Study Results:   Laboratory  All laboratory results reviewed without evidence of clinically relevant pathology.    EKG EKG was reviewed independently. Rate, rhythm, axis, intervals all examined and without medically relevant abnormality. ST segments without concerns for elevations.    Radiology All images reviewed independently. Agree with radiology report at this time.   CT Renal Stone Study  Result Date: 08/25/2022 CLINICAL DATA:  Left flank pain x2 weeks EXAM: CT ABDOMEN AND PELVIS WITHOUT CONTRAST TECHNIQUE: Multidetector CT imaging of the abdomen and pelvis was performed following the standard protocol without IV contrast. RADIATION DOSE REDUCTION: This exam was performed according to the departmental dose-optimization program which includes automated exposure control, adjustment of the mA and/or kV according to patient size and/or use of iterative reconstruction technique. COMPARISON:  None Available. FINDINGS: Lower chest: Breathing motion limits evaluation of lower  lung fields. There is no focal consolidation. Calcifications are seen in mitral annulus. Hepatobiliary: Liver measures 17.7 cm in length. No focal abnormalities are seen. Gallbladder is unremarkable. Pancreas: No focal abnormalities are seen. Spleen: Unremarkable. Adrenals/Urinary Tract: There is mild hyperplasia of adrenals. There is 1.2 cm nodule in the left lobe. Density measurements are less than 0 Hounsfield units suggesting adenoma. There is no hydronephrosis. There are no renal or ureteral stones. Urinary bladder is unremarkable. Stomach/Bowel: Stomach is not distended. Small bowel loops are not dilated. Appendix is not distinctly seen. There is no pericecal stranding. There is no significant wall thickening in colon. Scattered diverticula are seen in colon without signs of focal acute diverticulitis. Vascular/Lymphatic: Arterial calcifications are seen. Reproductive: Unremarkable. Other: There is no ascites or pneumoperitoneum. Small umbilical hernia containing fat is seen. Left inguinal hernia containing fat is seen. Musculoskeletal: No acute findings are seen. IMPRESSION: There is no evidence of intestinal obstruction or pneumoperitoneum. There is no hydronephrosis. There are no renal or ureteral stones. Scattered diverticula are seen in colon without signs of focal diverticulitis. Arteriosclerosis. There is possible 1.2 cm adenoma in left adrenal. Other findings as described in the body of the report. Electronically Signed  By: Elmer Picker M.D.   On: 08/25/2022 16:17   Final Reassessment and Plan:   Objective evaluation resulted with no acute pathology.  Patient remains in no acute distress with no pain.  Refused pain medication because symptoms are otherwise mild.Given overall well appearance, lack of any emergent pathology identified, patient stable for outpatient care and management with strict return precautions, 48-hour follow-up with PCP.  Patient discharged with no further acute  events.     Clinical Impression:  1. Flank pain      Discharge   Final Clinical Impression(s) / ED Diagnoses Final diagnoses:  Flank pain    Rx / DC Orders ED Discharge Orders     None         Tretha Sciara, MD 08/25/22 1725

## 2022-08-27 IMAGING — US US BREAST*L* LIMITED INC AXILLA
1 series · 12 of 14 positions shown · non-contrast
Comparison: Previous exam(s).

CLINICAL DATA: LEFT nipple inversion, increased over multiple
months. Chronic RIGHT nipple inversion as well.

EXAM:
DIGITAL DIAGNOSTIC BILATERAL MAMMOGRAM WITH TOMOSYNTHESIS AND CAD;
ULTRASOUND LEFT BREAST LIMITED
TECHNIQUE: Bilateral digital diagnostic mammography and breast tomosynthesis
was performed. The images were evaluated with computer-aided
detection.; Targeted ultrasound examination of the left breast was
performed.

[Series 1: us breast*left* limited inc axilla · 0.06mm/px · 12 of 14 slices shown]
[im 1/14]
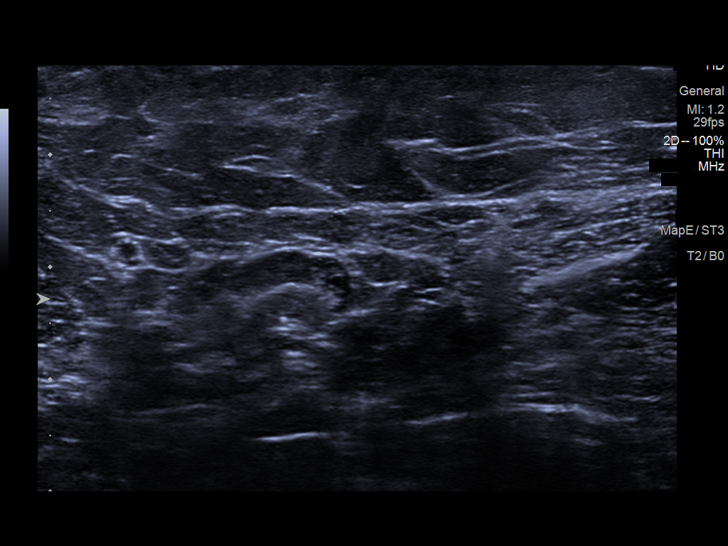
[im 2/14]
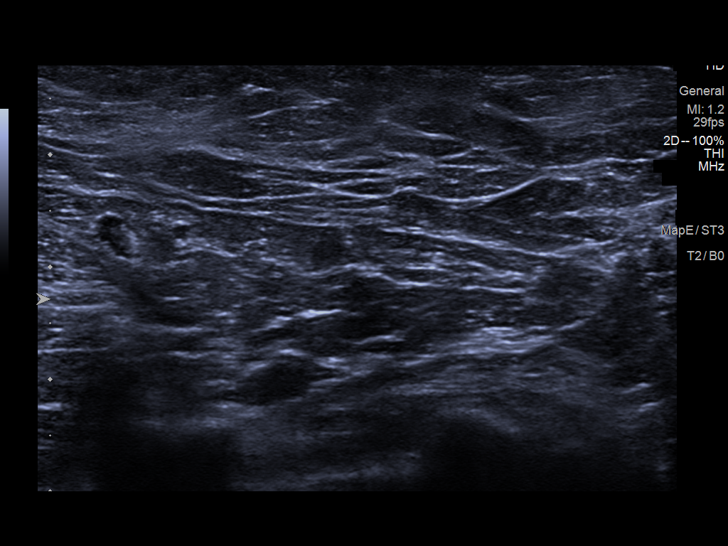
[im 3/14]
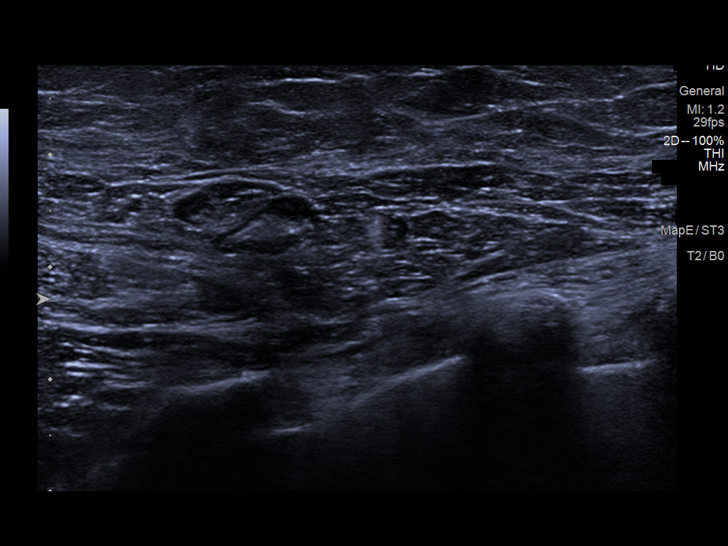
[im 5/14]
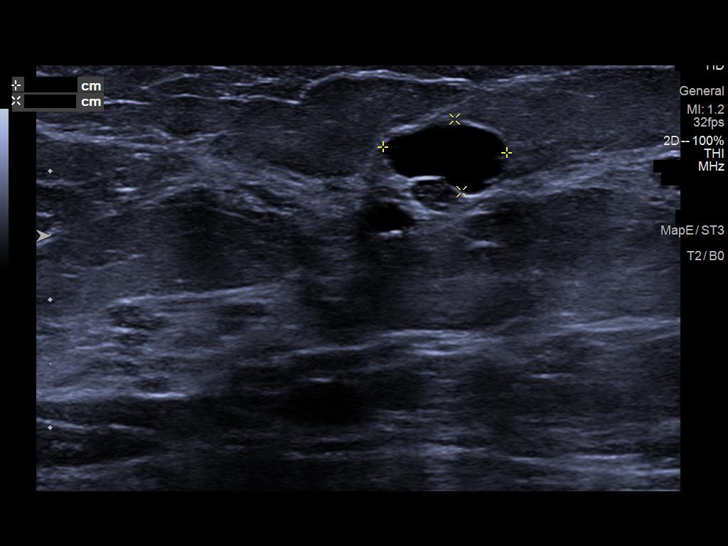
[im 6/14]
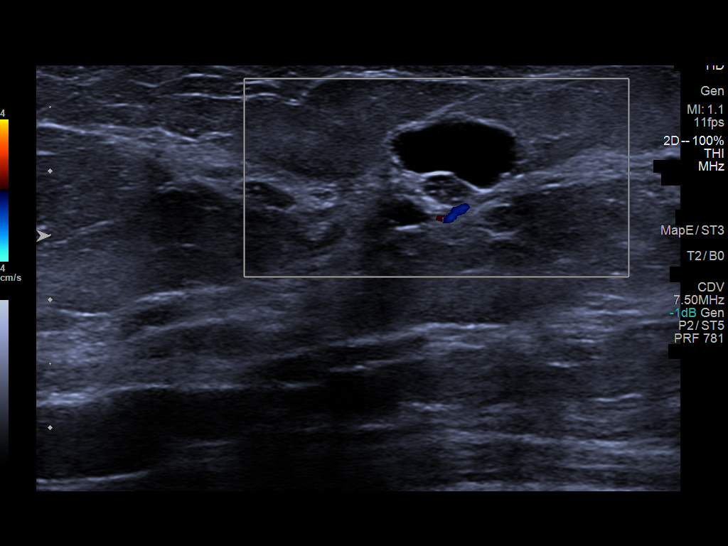
[im 7/14]
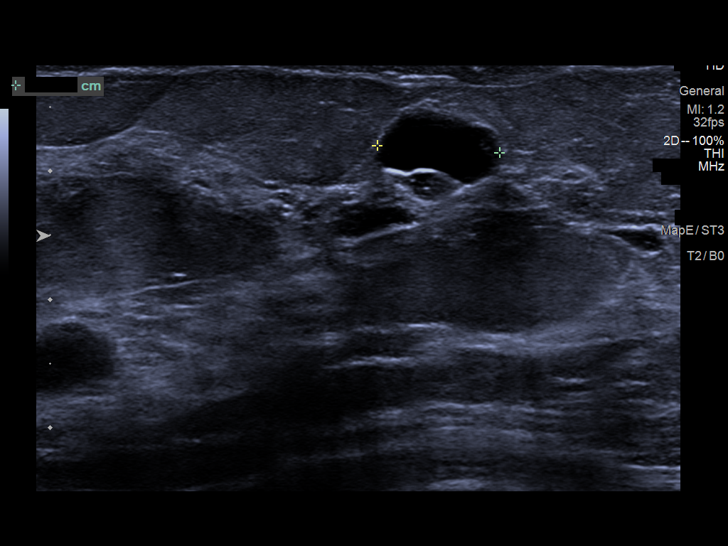
[im 8/14]
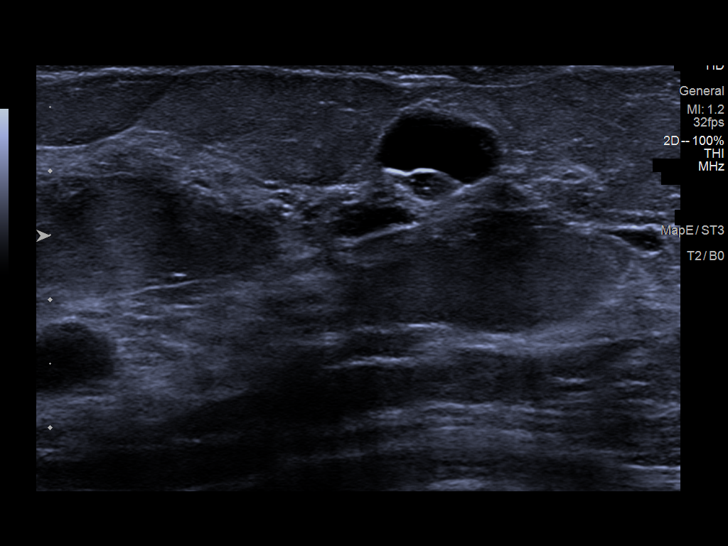
[im 9/14]
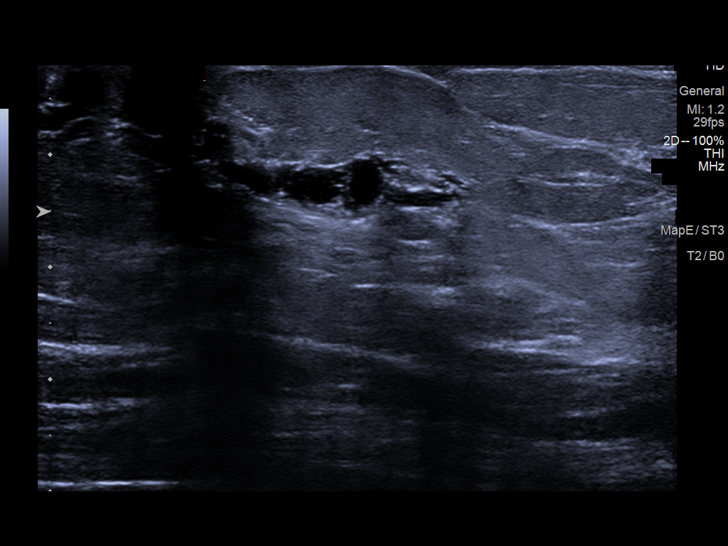
[im 10/14]
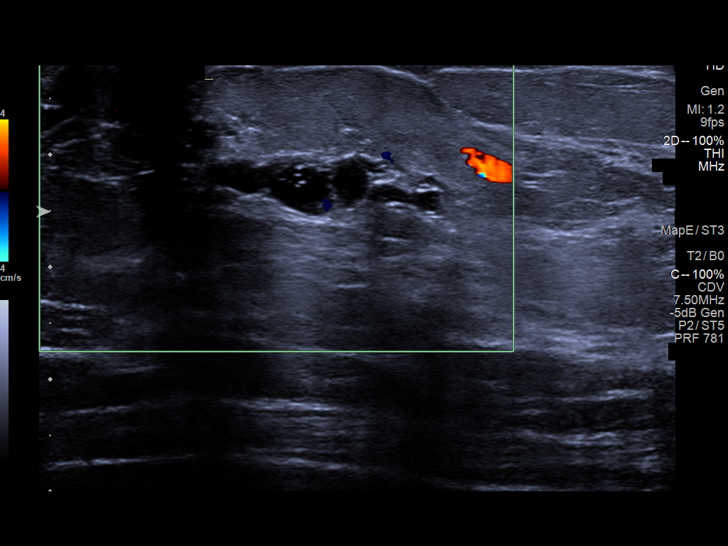
[im 12/14]
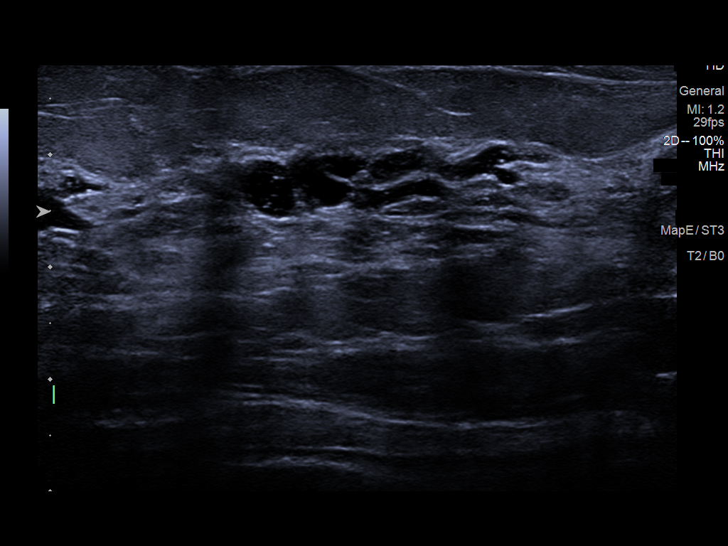
[im 13/14]
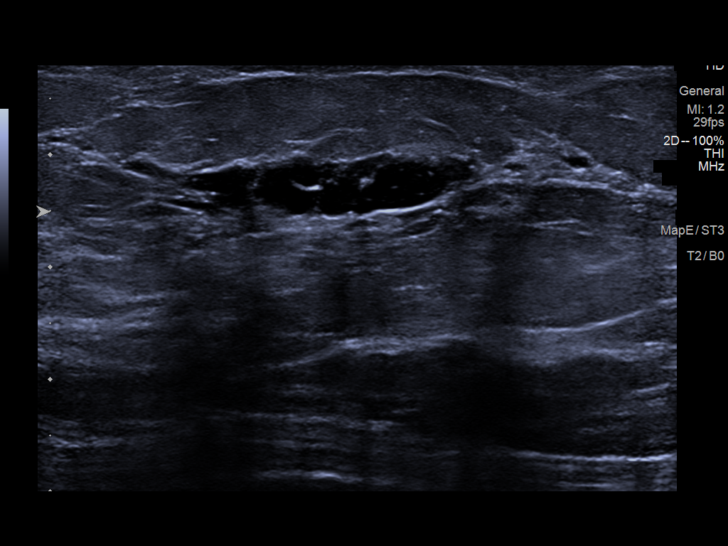
[im 14/14]
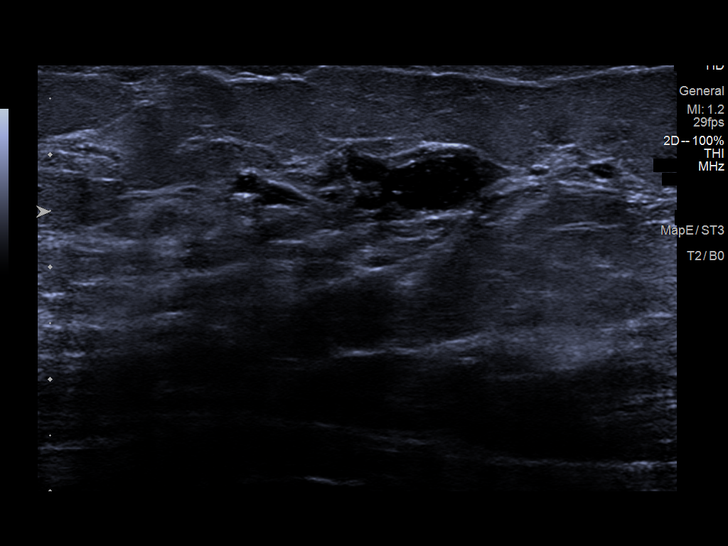

[12 of 14 positions shown; findings below may reference images not displayed]

ACR Breast Density Category c: The breast tissue is heterogeneously
dense, which may obscure small masses.
FINDINGS: Spot compression tomosynthesis views were obtained of the site of
concern in the LEFT retroareolar breast. No suspicious mammographic
etiology for nipple retraction is identified. LEFT-sided nipple
retraction is identified mammographically. There is a tubular
asymmetry noted in the LEFT outer breast which is similar in
comparison to prior mammograms. This likely reflects duct ectasia.
There are multiple round and oval masses which have waxed and waned
since prior exam. These are most consistent with benign cysts. One
such mass has increased in the RIGHT outer breast at anterior depth.
No suspicious mass, distortion, or microcalcifications are
identified to suggest presence of malignancy.

On physical exam, there is bilateral nipple inversion with nipple
retraction being more prominent on the LEFT side. No suspicious mass
is appreciated on physical exam.

Targeted LEFT retroareolar ultrasound was performed. Multiple
dilated anechoic ducts are noted. A duct at 3 o'clock in the
retroareolar breast demonstrates internal punctate echogenic debris
which is mobile in some portions. This corresponds to the tubular
mass noted on mammogram and is consistent with duct ectasia with
inspissated debris. Multiple benign cysts are noted on real-time
examination.

Targeted RIGHT outer breast ultrasound was performed for comparison
purposes. At 9 o'clock 2 cm from the nipple, there is an oval
circumscribed anechoic mass with posterior acoustic enhancement.
This is consistent with a benign cyst and corresponds to the
enlarging oval mass noted mammographically. Benign RIGHT breast
ectasia is noted during real-time examination.
IMPRESSION: 1. No suspicious mammographic or sonographic etiology for asymmetric
LEFT nipple retraction is identified. Given breast tissue
heterogeneity, recommend consideration of breast MRI with and
without contrast for additional evaluation to exclude
mammographically occult etiology for nipple retraction. Otherwise,
recommend annual screening mammography.
2. No mammographic evidence of malignancy in the RIGHT breast. There
is benign bilateral duct ectasia and multiple benign cysts
bilaterally.

RECOMMENDATION:
1.  Screening mammogram in one year.(Code:K5-D-IA2)
2. Given breast tissue heterogeneity and asymmetric nipple
retraction, recommend consideration of breast MRI with and without
contrast.

I have discussed the findings and recommendations with the patient.
If applicable, a reminder letter will be sent to the patient
regarding the next appointment.

BI-RADS CATEGORY  2: Benign.

## 2022-09-05 NOTE — Telephone Encounter (Signed)
Pt is scheduled with Dr. Marcelline Mates on 09/21/2022.

## 2022-09-16 ENCOUNTER — Other Ambulatory Visit: Payer: Self-pay | Admitting: Obstetrics and Gynecology

## 2022-09-19 NOTE — Patient Instructions (Signed)
Preventive Care 65 Years and Older, Female Preventive care refers to lifestyle choices and visits with your health care provider that can promote health and wellness. Preventive care visits are also called wellness exams. What can I expect for my preventive care visit? Counseling Your health care provider may ask you questions about your: Medical history, including: Past medical problems. Family medical history. Pregnancy and menstrual history. History of falls. Current health, including: Memory and ability to understand (cognition). Emotional well-being. Home life and relationship well-being. Sexual activity and sexual health. Lifestyle, including: Alcohol, nicotine or tobacco, and drug use. Access to firearms. Diet, exercise, and sleep habits. Work and work environment. Sunscreen use. Safety issues such as seatbelt and bike helmet use. Physical exam Your health care provider will check your: Height and weight. These may be used to calculate your BMI (body mass index). BMI is a measurement that tells if you are at a healthy weight. Waist circumference. This measures the distance around your waistline. This measurement also tells if you are at a healthy weight and may help predict your risk of certain diseases, such as type 2 diabetes and high blood pressure. Heart rate and blood pressure. Body temperature. Skin for abnormal spots. What immunizations do I need?  Vaccines are usually given at various ages, according to a schedule. Your health care provider will recommend vaccines for you based on your age, medical history, and lifestyle or other factors, such as travel or where you work. What tests do I need? Screening Your health care provider may recommend screening tests for certain conditions. This may include: Lipid and cholesterol levels. Hepatitis C test. Hepatitis B test. HIV (human immunodeficiency virus) test. STI (sexually transmitted infection) testing, if you are at  risk. Lung cancer screening. Colorectal cancer screening. Diabetes screening. This is done by checking your blood sugar (glucose) after you have not eaten for a while (fasting). Mammogram. Talk with your health care provider about how often you should have regular mammograms. BRCA-related cancer screening. This may be done if you have a family history of breast, ovarian, tubal, or peritoneal cancers. Bone density scan. This is done to screen for osteoporosis. Talk with your health care provider about your test results, treatment options, and if necessary, the need for more tests. Follow these instructions at home: Eating and drinking  Eat a diet that includes fresh fruits and vegetables, whole grains, lean protein, and low-fat dairy products. Limit your intake of foods with high amounts of sugar, saturated fats, and salt. Take vitamin and mineral supplements as recommended by your health care provider. Do not drink alcohol if your health care provider tells you not to drink. If you drink alcohol: Limit how much you have to 0-1 drink a day. Know how much alcohol is in your drink. In the [place] one drink equals one 12 oz bottle of beer (355 mL), one 5 oz glass of wine (148 mL), or one 1 oz glass of hard liquor (44 mL). Lifestyle Brush your teeth every morning and night with fluoride toothpaste. Floss one time each day. Exercise for at least 30 minutes 5 or more days each week. Do not use any products that contain nicotine or tobacco. These products include cigarettes, chewing tobacco, and vaping devices, such as e-cigarettes. If you need help quitting, ask your health care provider. Do not use drugs. If you are sexually active, practice safe sex. Use a condom or other form of protection in order to prevent STIs. Take aspirin only as told by   your health care provider. Make sure that you understand how much to take and what form to take. Work with your health care provider to find out whether it  is safe and beneficial for you to take aspirin daily. Ask your health care provider if you need to take a cholesterol-lowering medicine (statin). Find healthy ways to manage stress, such as: Meditation, yoga, or listening to music. Journaling. Talking to a trusted person. Spending time with friends and family. Minimize exposure to UV radiation to reduce your risk of skin cancer. Safety Always wear your seat belt while driving or riding in a vehicle. Do not drive: If you have been drinking alcohol. Do not ride with someone who has been drinking. When you are tired or distracted. While texting. If you have been using any mind-altering substances or drugs. Wear a helmet and other protective equipment during sports activities. If you have firearms in your house, make sure you follow all gun safety procedures. What's next? Visit your health care provider once a year for an annual wellness visit. Ask your health care provider how often you should have your eyes and teeth checked. Stay up to date on all vaccines. This information is not intended to replace advice given to you by your health care provider. Make sure you discuss any questions you have with your health care provider. Document Revised: 04/21/2021 Document Reviewed: 04/21/2021 Elsevier Patient Education  [place] Breast self-awareness is knowing how your breasts look and feel. You need to: Check your breasts on a regular basis. Tell your doctor about any changes. Become familiar with the look and feel of your breasts. This can help you catch a breast problem while it is still small and can be treated. You should do breast self-exams even if you have breast implants. What you need: A mirror. A well-lit room. A pillow or other soft object. How to do a breast self-exam Follow these steps to do a breast self-exam: Look for changes  Take off all the clothes above your waist. Stand in front of a  mirror in a room with good lighting. Put your hands down at your sides. Compare your breasts in the mirror. Look for any difference between them, such as: A difference in shape. A difference in size. Wrinkles, dips, and bumps in one breast and not the other. Look at each breast for changes in the skin, such as: Redness. Scaly areas. Skin that has gotten thicker. Dimpling. Open sores (ulcers). Look for changes in your nipples, such as: Fluid coming out of a nipple. Fluid around a nipple. Bleeding. Dimpling. Redness. A nipple that looks pushed in (retracted), or that has changed position. Feel for changes Lie on your back. Feel each breast. To do this: Pick a breast to feel. Place a pillow under the shoulder closest to that breast. Put the arm closest to that breast behind your head. Feel the nipple area of that breast using the hand of your other arm. Feel the area with the pads of your three middle fingers by making small circles with your fingers. Use light, medium, and firm pressure. Continue the overlapping circles, moving downward over the breast. Keep making circles with your fingers. Stop when you feel your ribs. Start making circles with your fingers again, this time going upward until you reach your collarbone. Then, make circles outward across your breast and into your armpit area. Squeeze your nipple. Check for discharge and lumps. Repeat these steps to check your other  breast. Sit or stand in the tub or shower. With soapy water on your skin, feel each breast the same way you did when you were lying down. Write down what you find Writing down what you find can help you remember what to tell your doctor. Write down: What is normal for each breast. Any changes you find in each breast. These include: The kind of changes you find. A tender or painful breast. Any lump you find. Write down its size and where it is. When you last had your monthly period (menstrual  cycle). General tips If you are breastfeeding, the best time to check your breasts is after you feed your baby or after you use a breast pump. If you get monthly bleeding, the best time to check your breasts is 5-7 days after your monthly cycle ends. With time, you will become comfortable with the self-exam. You will also start to know if there are changes in your breasts. Contact a doctor if: You see a change in the shape or size of your breasts or nipples. You see a change in the skin of your breast or nipples, such as red or scaly skin. You have fluid coming from your nipples that is not normal. You find a new lump or thick area. You have breast pain. You have any concerns about your breast health. Summary Breast self-awareness includes looking for changes in your breasts and feeling for changes within your breasts. You should do breast self-awareness in front of a mirror in a well-lit room. If you get monthly periods (menstrual cycles), the best time to check your breasts is 5-7 days after your period ends. Tell your doctor about any changes you see in your breasts. Changes include changes in size, changes on the skin, painful or tender breasts, or fluid from your nipples that is not normal. This information is not intended to replace advice given to you by your health care provider. Make sure you discuss any questions you have with your health care provider. Document Revised: 03/31/2022 Document Reviewed: 08/26/2021 Elsevier Patient Education  [place].

## 2022-09-19 NOTE — Progress Notes (Unsigned)
ANNUAL PREVENTATIVE CARE GYNECOLOGY  ENCOUNTER NOTE  Subjective:       Colleen Mcneil is a 66 y.o. [phone] female here for a routine annual gynecologic exam.  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 The patient is not sexually active. The patient has never taken hormone replacement therapy. Patient denies post-menopausal vaginal bleeding. The patient wears seatbelts: yes. The patient participates in regular exercise: reports working out in the yard.   Current complaints: 1.       Gynecologic History No LMP recorded. Patient is postmenopausal. Contraception: post menopausal status Last Pap: 06/03/2020. Results were: normal Last mammogram: 01/25/203. Results were: normal Last Colonoscopy: 05/20/2016. Results were normal. Overdue for next colonoscopy Last Dexa Scan: 08/24/2018. Osteopenia.  T score is -1.8.     Obstetric History OB History  Gravida Para Term Preterm AB Living  '3 3 2 1   3  '$ SAB IAB Ectopic Multiple Live Births          3    # Outcome Date GA Lbr Len/2nd Weight Sex Delivery Anes PTL Lv  3 Preterm 5    M CS-Unspec   LIV  2 Term 1982    F Vag-Spont   LIV  1 Term 40    M Vag-Spont   LIV    Past Medical History:  Diagnosis Date   Anxiety    Depression    Elevated cholesterol    Genital warts 1976   pt was treated with dyphylin   Menopause syndrome    Vitamin D deficiency    Vulvar lesion     Family History  Problem Relation Age of Onset   Heart disease Mother    Diabetes Mother    Diabetes Father    Liver disease Son    Breast cancer Neg Hx     Past Surgical History:  Procedure Laterality Date   CESAREAN SECTION  1986   COLONOSCOPY WITH PROPOFOL N/A 05/20/2016   Procedure: COLONOSCOPY WITH PROPOFOL;  Surgeon: Lucilla Lame, MD;  Location: [facility];  Service: Endoscopy;  Laterality: N/A;   HERNIA REPAIR     INTRAVASCULAR PRESSURE WIRE/FFR STUDY N/A 06/06/2022   Procedure: INTRAVASCULAR  PRESSURE WIRE/FFR STUDY;  Surgeon: Lorretta Harp, MD;  Location: [facility];  Service: Cardiovascular;  Laterality: N/A;   LEFT HEART CATH AND CORONARY ANGIOGRAPHY N/A 06/06/2022   Procedure: LEFT HEART CATH AND CORONARY ANGIOGRAPHY;  Surgeon: Lorretta Harp, MD;  Location: [facility];  Service: Cardiovascular;  Laterality: N/A;   POLYPECTOMY  05/20/2016   Procedure: POLYPECTOMY;  Surgeon: Lucilla Lame, MD;  Location: [facility];  Service: Endoscopy;;   TUBAL LIGATION      Social History   Socioeconomic History   Marital status: Widowed    Spouse name: Not on file   Number of children: Not on file   Years of education: Not on file   Highest education level: Not on file  Occupational History   Not on file  Tobacco Use   Smoking status: Every Day    Packs/day: 0.50    Years: 25.00    Total pack years: 12.50    Types: Cigarettes   Smokeless tobacco: Never  Vaping Use   Vaping Use: Never used  Substance and Sexual Activity   Alcohol use: No   Drug use: No   Sexual activity: Not Currently  Other Topics Concern   Not on file  Social  History Narrative   Not on file   Social Determinants of Health   Financial Resource Strain: Not on file  Food Insecurity: Not on file  Transportation Needs: Not on file  Physical Activity: Not on file  Stress: Not on file  Social Connections: Not on file  Intimate Partner Violence: Not on file    Current Outpatient Medications on File Prior to Visit  Medication Sig Dispense Refill   acetaminophen (TYLENOL) 325 MG tablet Take 325 mg by mouth every 4 (four) hours as needed for mild pain.     aspirin EC 81 MG tablet Take 81 mg by mouth every 4 (four) hours as needed for mild pain. Swallow whole.     clonazePAM (KLONOPIN) 1 MG tablet TAKE 1 TABLET DAILY AS NEEDED FOR ANXIETY (Patient taking differently: Take 0.5 mg by mouth daily as needed for anxiety.) 90 tablet 2   FLUoxetine (PROZAC) 10 MG capsule Take 1 capsule  (10 mg total) by mouth daily. TAKE (1) CAPSULES BY MOUTH ONCE DAILY. (Patient taking differently: Take 10 mg by mouth daily.) 90 capsule 3   isosorbide mononitrate (IMDUR) 30 MG 24 hr tablet Take 1 tablet (30 mg total) by mouth daily. 90 tablet 1   nitroGLYCERIN (NITROSTAT) 0.4 MG SL tablet Place 1 tablet (0.4 mg total) under the tongue every 5 (five) minutes as needed for chest pain. 25 tablet 2   rosuvastatin (CRESTOR) 40 MG tablet Take 1 tablet (40 mg total) by mouth daily. 90 tablet 1   No current facility-administered medications on file prior to visit.    Allergies  Allergen Reactions   Macrobid [Nitrofurantoin Monohyd Macro] Nausea And Vomiting   Nitrofurantoin Nausea And Vomiting   Sulfamethoxazole-Trimethoprim     unknown      Review of Systems ROS Review of Systems - General ROS: negative for - chills, fatigue, fever, hot flashes, night sweats, weight gain or weight loss Psychological ROS: negative for - anxiety, decreased libido, depression, mood swings, physical abuse or sexual abuse Ophthalmic ROS: negative for - blurry vision, eye pain or loss of vision ENT ROS: negative for - headaches, hearing change, visual changes or vocal changes Allergy and Immunology ROS: negative for - hives, itchy/watery eyes or seasonal allergies Hematological and Lymphatic ROS: negative for - bleeding problems, bruising, swollen lymph nodes or weight loss Endocrine ROS: negative for - galactorrhea, hair pattern changes, hot flashes, malaise/lethargy, mood swings, palpitations, polydipsia/polyuria, skin changes, temperature intolerance or unexpected weight changes Breast ROS: negative for - new or changing breast lumps or nipple discharge Respiratory ROS: negative for - cough or shortness of breath Cardiovascular ROS: negative for - chest pain, irregular heartbeat, palpitations or shortness of breath Gastrointestinal ROS: no abdominal pain, change in bowel habits, or black or bloody  stools Genito-Urinary ROS: no dysuria, trouble voiding, or hematuria Musculoskeletal ROS: negative for - joint pain or joint stiffness Neurological ROS: negative for - bowel and bladder control changes Dermatological ROS: negative for rash and skin lesion changes   Objective:   There were no vitals taken for this visit. CONSTITUTIONAL: Well-developed, well-nourished female in no acute distress.  PSYCHIATRIC: Normal mood and affect. Normal behavior. Normal judgment and thought content. [place]: Alert and oriented to person, place, and time. Normal muscle tone coordination. No cranial nerve deficit noted. HENT:  Normocephalic, atraumatic, External right and left ear normal. Oropharynx is clear and moist EYES: Conjunctivae and EOM are normal. Pupils are equal, round, and reactive to light. No scleral icterus.  NECK: Normal  range of motion, supple, no masses.  Normal thyroid.  SKIN: Skin is warm and dry. No rash noted. Not diaphoretic. No erythema. No pallor. CARDIOVASCULAR: Normal heart rate noted, regular rhythm, no murmur. RESPIRATORY: Clear to auscultation bilaterally. Effort and breath sounds normal, no problems with respiration noted. BREASTS: Symmetric in size. No masses, skin changes, nipple drainage, or lymphadenopathy. ABDOMEN: Soft, normal bowel sounds, no distention noted.  No tenderness, rebound or guarding.  BLADDER: Normal PELVIC:  Bladder {:311640}  Urethra: {:311719}  Vulva: {:311722}  Vagina: {:311643}  Cervix: {:311644}  Uterus: {:311718}  Adnexa: {:311645}  RV: {Blank multiple:19196::"External Exam NormaI","No Rectal Masses","Normal Sphincter tone"}  MUSCULOSKELETAL: Normal range of motion. No tenderness.  No cyanosis, clubbing, or edema.  2+ distal pulses. LYMPHATIC: No Axillary, Supraclavicular, or Inguinal Adenopathy.   Labs: Lab Results  Component Value Date   WBC 5.6 08/25/2022   HGB 11.7 (L) 08/25/2022   HCT 35.5 (L) 08/25/2022   MCV 92.7 08/25/2022    PLT 230 08/25/2022    Lab Results  Component Value Date   CREATININE 0.50 08/25/2022   BUN 13 08/25/2022   NA 140 08/25/2022   K 3.8 08/25/2022   CL 107 08/25/2022   CO2 27 08/25/2022    Lab Results  Component Value Date   ALT 11 06/02/2022   AST 16 06/02/2022   ALKPHOS 45 06/02/2022   BILITOT 0.4 06/02/2022    Lab Results  Component Value Date   CHOL 200 06/04/2022   HDL 46 06/04/2022   LDLCALC 142 (H) 06/04/2022   TRIG 62 06/04/2022   CHOLHDL 4.3 06/04/2022    Lab Results  Component Value Date   TSH 2.360 08/09/2019    Lab Results  Component Value Date   HGBA1C 5.8 (H) 06/04/2022     Assessment:   No diagnosis found.   Plan:  Pap: Not needed Mammogram:  UTD Colon Screening:  Ordered Labs:  UTD Routine preventative health maintenance measures emphasized: Exercise/Diet/Weight control, Tobacco Warnings, Alcohol/Substance use risks, Stress Management, Peer Pressure Issues, and Safe Sex COVID Vaccination status: Return to [place], [place]

## 2022-09-21 ENCOUNTER — Ambulatory Visit (INDEPENDENT_AMBULATORY_CARE_PROVIDER_SITE_OTHER): Payer: 59 | Admitting: Obstetrics and Gynecology

## 2022-09-21 ENCOUNTER — Telehealth: Payer: Self-pay | Admitting: *Deleted

## 2022-09-21 ENCOUNTER — Encounter: Payer: Self-pay | Admitting: Gastroenterology

## 2022-09-21 ENCOUNTER — Other Ambulatory Visit: Payer: Self-pay | Admitting: *Deleted

## 2022-09-21 ENCOUNTER — Telehealth: Payer: Self-pay

## 2022-09-21 ENCOUNTER — Encounter: Payer: Self-pay | Admitting: Obstetrics and Gynecology

## 2022-09-21 ENCOUNTER — Other Ambulatory Visit: Payer: Self-pay

## 2022-09-21 VITALS — BP 140/77 | HR 62 | Resp 16 | Ht 66.0 in | Wt 141.0 lb

## 2022-09-21 DIAGNOSIS — E785 Hyperlipidemia, unspecified: Secondary | ICD-10-CM

## 2022-09-21 DIAGNOSIS — Z86010 Personal history of colonic polyps: Secondary | ICD-10-CM

## 2022-09-21 DIAGNOSIS — Z01419 Encounter for gynecological examination (general) (routine) without abnormal findings: Secondary | ICD-10-CM

## 2022-09-21 DIAGNOSIS — N6459 Other signs and symptoms in breast: Secondary | ICD-10-CM

## 2022-09-21 DIAGNOSIS — N8110 Cystocele, unspecified: Secondary | ICD-10-CM

## 2022-09-21 DIAGNOSIS — R7303 Prediabetes: Secondary | ICD-10-CM

## 2022-09-21 DIAGNOSIS — Z01411 Encounter for gynecological examination (general) (routine) with abnormal findings: Secondary | ICD-10-CM

## 2022-09-21 DIAGNOSIS — F32A Depression, unspecified: Secondary | ICD-10-CM

## 2022-09-21 MED ORDER — SUTAB 1479-225-188 MG PO TABS: 1.0000 | ORAL_TABLET | Freq: Once | ORAL | 0 refills | Status: AC

## 2022-09-21 MED ORDER — FLUOXETINE HCL 20 MG PO CAPS: 20.0000 mg | ORAL_CAPSULE | Freq: Every day | ORAL | 3 refills | Status: DC

## 2022-09-21 NOTE — Telephone Encounter (Signed)
Pt agreeable to tele pre op appt 09/27/22 @ 9 am. 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 Med rec and consent are done.     Patient Consent for Virtual Visit        Colleen Mcneil has provided verbal consent on 09/21/2022 for a virtual visit (video or telephone).   CONSENT FOR VIRTUAL VISIT FOR:  Colleen Mcneil  By participating in this virtual visit I agree to the following:  I hereby voluntarily request, consent and authorize [facility] and its employed or contracted physicians, physician assistants, nurse practitioners or other licensed health care professionals (the Practitioner), to provide me with telemedicine health care services (the "Services") as deemed necessary by the treating Practitioner. I acknowledge and consent to receive the Services by the Practitioner via telemedicine. I understand that the telemedicine visit will involve communicating with the Practitioner through live audiovisual communication technology and the disclosure of certain medical information by electronic transmission. I acknowledge that I have been given the opportunity to request an in-person assessment or other available alternative prior to the telemedicine visit and am voluntarily participating in the telemedicine visit.  I understand that I have the right to withhold or withdraw my consent to the use of telemedicine in the course of my care at any time, without affecting my right to future care or treatment, and that the Practitioner or I may terminate the telemedicine visit at any time. I understand that I have the right to inspect all information obtained and/or recorded in the course of the telemedicine visit and may receive copies of available information for a reasonable fee.  I understand that some of the potential risks of receiving the Services via telemedicine include:  Delay or interruption in medical evaluation due to  technological equipment failure or disruption; Information transmitted may not be sufficient (e.g. poor resolution of images) to allow for appropriate medical decision making by the Practitioner; and/or  In rare instances, security protocols could fail, causing a breach of personal health information.  Furthermore, I acknowledge that it is my responsibility to provide information about my medical history, conditions and care that is complete and accurate to the best of my ability. I acknowledge that Practitioner's advice, recommendations, and/or decision may be based on factors not within their control, such as incomplete or inaccurate data provided by me or distortions of diagnostic images or specimens that may result from electronic transmissions. I understand that the practice of medicine is not an exact science and that Practitioner makes no warranties or guarantees regarding treatment outcomes. I acknowledge that a copy of this consent can be made available to me via my patient portal ([facility]), or I can request a printed copy by calling the office of [facility].    I understand that my insurance will be billed for this visit.   I have read or had this consent read to me. I understand the contents of this consent, which adequately explains the benefits and risks of the Services being provided via telemedicine.  I have been provided ample opportunity to ask questions regarding this consent and the Services and have had my questions answered to my satisfaction. I give my informed consent for the services to be provided through the use of telemedicine in my medical care

## 2022-09-21 NOTE — Telephone Encounter (Signed)
Left verbal message, pt is at work but was on a break right now.

## 2022-09-21 NOTE — Telephone Encounter (Signed)
Gastroenterology Pre-Procedure Review  Request Date: 10/03/2022 Requesting Physician: Dr. Allen Norris  PATIENT REVIEW QUESTIONS: The patient responded to the following health history questions as indicated:    1. Are you having any GI issues? no 2. Do you have a personal history of Polyps? yes (last colonoscopy 05/20/2016) 3. Do you have a family history of Colon Cancer or Polyps?yes 4. Diabetes Mellitus? no 5. Joint replacements in the past 12 months?no 6. Major health problems in the past 3 months?no 7. Any artificial heart valves, MVP, or defibrillator?no    MEDICATIONS & ALLERGIES:    Patient reports the following regarding taking any anticoagulation/antiplatelet therapy:   Plavix, Coumadin, Eliquis, Xarelto, Lovenox, Pradaxa, Brilinta, or Effient? no Aspirin?yes(81 mg)  Patient confirms/reports the following medications:  Current Outpatient Medications  Medication Sig Dispense Refill   Sodium Sulfate-Mag Sulfate-KCl (SUTAB) [phone] MG TABS Take 1 kit by mouth once for 1 dose. 12 tablet 0   acetaminophen (TYLENOL) 325 MG tablet Take 325 mg by mouth every 4 (four) hours as needed for mild pain.     aspirin EC 81 MG tablet Take 81 mg by mouth every 4 (four) hours as needed for mild pain. Swallow whole.     clonazePAM (KLONOPIN) 1 MG tablet TAKE 1 TABLET BY MOUTH ONCE DAILY AS NEEDED FOR ANXIETY 30 tablet 5   FLUoxetine (PROZAC) 20 MG capsule Take 1 capsule (20 mg total) by mouth daily. 90 capsule 3   isosorbide mononitrate (IMDUR) 30 MG 24 hr tablet Take 1 tablet (30 mg total) by mouth daily. 90 tablet 1   nitroGLYCERIN (NITROSTAT) 0.4 MG SL tablet Place 1 tablet (0.4 mg total) under the tongue every 5 (five) minutes as needed for chest pain. 25 tablet 2   rosuvastatin (CRESTOR) 40 MG tablet Take 1 tablet (40 mg total) by mouth daily. 90 tablet 1   No current facility-administered medications for this visit.    Patient confirms/reports the following allergies:  Allergies  Allergen  Reactions   Macrobid [Nitrofurantoin Monohyd Macro] Nausea And Vomiting   Nitrofurantoin Nausea And Vomiting   Sulfamethoxazole-Trimethoprim     unknown    No orders of the defined types were placed in this encounter.   AUTHORIZATION INFORMATION Primary Insurance: 1D#: Group #:  Secondary Insurance: 1D#: Group #:  SCHEDULE INFORMATION: Date: 10/03/2022 Time: Location: [facility]

## 2022-09-21 NOTE — Telephone Encounter (Signed)
Pt agreeable to tele pre op appt 09/27/22 @ 9 am. 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 Med rec and consent are done.

## 2022-09-21 NOTE — Telephone Encounter (Signed)
   Pre-operative Risk Assessment    Patient Name: Colleen Mcneil  DOB: 1956/04/26 MRN: [id number]      Request for Surgical Clearance    Procedure:   Colonoscopy  Date of Surgery:  Clearance 10/03/22                                 Surgeon:    Surgeon's Group or Practice Name:  [facility] Phone number:  [phone] Fax number:  [phone] Vallarie Mare, CMA   Type of Clearance Requested:   - Medical    Type of Anesthesia:  General    Additional requests/questions:    Erin Hearing   09/21/2022, 11:48 AM

## 2022-09-21 NOTE — Telephone Encounter (Signed)
   Name: DORALENE GLANZ  DOB: 1955/11/17  MRN: [id number]  Primary Cardiologist: Quay Burow, MD  Chart reviewed as part of pre-operative protocol coverage. Because of Starasia Sinko Secrist's past medical history and time since last visit, she will require a follow-up telephone visit in order to better assess preoperative cardiovascular risk.  Pre-op covering staff: - Please schedule appointment and call patient to inform them. If patient already had an upcoming appointment within acceptable timeframe, please add "pre-op clearance" to the appointment notes so provider is aware. - Please contact requesting surgeon's office via preferred method (i.e, phone, fax) to inform them of need for appointment prior to surgery.   No medications indicated as needing held.   Elgie Collard, PA-C  09/21/2022, 12:05 PM

## 2022-09-22 ENCOUNTER — Encounter: Payer: Self-pay | Admitting: Anesthesiology

## 2022-09-26 ENCOUNTER — Telehealth: Payer: Self-pay

## 2022-09-26 NOTE — Telephone Encounter (Signed)
Pt called and said she is post poning her procedure with  GI at this time. Pt states she son is in the [employer] and he is coming home for Thanksgiving and she is going to spend the time with her son. I did ask the pt to let the requesting office know when they are ready to reschedule procedure to fax over a new request for pre op clearance. Pt said she would do that. I assured the pt that I will send a note as well. Pt thanked me for the help today.

## 2022-09-26 NOTE — Telephone Encounter (Signed)
Received message from [facility] stating patient has requested to reschedule her colonoscopy with Dr.Wohl to a Monday of next year.  Left voice message for her to call me back.  I will send her a mychart message as well.  Thanks,  [place], [place]

## 2022-09-27 ENCOUNTER — Ambulatory Visit: Payer: 59

## 2022-09-27 ENCOUNTER — Other Ambulatory Visit: Payer: Self-pay | Admitting: *Deleted

## 2022-09-27 ENCOUNTER — Telehealth: Payer: Self-pay

## 2022-09-27 DIAGNOSIS — Z86010 Personal history of colonic polyps: Secondary | ICD-10-CM

## 2022-09-27 NOTE — Telephone Encounter (Signed)
Patient left a voicemail regarding rescheduling her colonoscopy. I have apologize to patient that I was out of the office yesterday 09/26/2022.  We have reschedule the colonoscopy to 11/21/2022. New instructions will be sent.  Patient verbalized understanding.

## 2022-09-27 NOTE — Telephone Encounter (Signed)
    Primary Cardiologist:Jonathan Gwenlyn Found, MD  Chart reviewed as part of pre-operative protocol coverage. Because of Colleen Mcneil Orth's past medical history and time since last visit, he/she will require a follow-up visit in order to better assess preoperative cardiovascular risk.  Pre-op covering staff: - Please schedule appointment and call patient to inform them. - Please contact requesting surgeon's office via preferred method (i.e, phone, fax) to inform them of need for appointment prior to surgery.  If applicable, this message will also be routed to pharmacy pool and/or primary cardiologist for input on holding anticoagulant/antiplatelet agent as requested below so that this information is available at time of patient's appointment.   Deberah Pelton, NP  09/27/2022, 1:54 PM

## 2022-09-27 NOTE — Telephone Encounter (Signed)
Please see notes from 09/21/22 clearance 10/03/22. Pt is post poning her procedure, see notes

## 2022-09-27 NOTE — Telephone Encounter (Signed)
   Pre-operative Risk Assessment    Patient Name: Colleen Mcneil  DOB: Mar 10, 1956 MRN: [id number]      Request for Surgical Clearance    Procedure:   Colonoscopy  Date of Surgery:  Clearance 10/03/22                                 Surgeon:  V. Quincy Simmonds Surgeon's Group or Practice Name:  [facility] Phone number:  [phone] Fax number:  [phone], Vallarie Mare   Type of Clearance Requested:   - Medical    Type of Anesthesia:  General    Additional requests/questions:  Please advise surgeon/provider what medications should be held.  Signed, Elsie Lincoln Fancy Dunkley   09/27/2022, 12:28 PM

## 2022-09-28 ENCOUNTER — Telehealth: Payer: Self-pay

## 2022-09-28 NOTE — Telephone Encounter (Signed)
   Pre-operative Risk Assessment    Patient Name: Colleen Mcneil  DOB: [birth year] MRN: [id number]      Request for Surgical Clearance    Procedure:   Colonoscopy  Date of Surgery:  Clearance 11/21/22                                 Surgeon:  Dr. Allen Norris Surgeon's Group or Practice Name:  [facility]  Phone number:  [phone] Fax number:  [phone]   Type of Clearance Requested:   - Medical    Type of Anesthesia:  General    Additional requests/questions:    SignedJacqulynn Cadet   09/28/2022, 9:58 AM

## 2022-09-28 NOTE — Telephone Encounter (Signed)
Patient has colonoscopy planned for 11/21/2022 and is scheduled to see Dr. Rockey Situ on 10/18/2022; therefore, pre-op evaluation can be completed at that time. Will route this to Dr. Rockey Situ so that he is aware and will add "PREOP EVAL" to appointment visit notes. Will remove from pre-op pool.  Darreld Mclean, PA-C 09/28/2022 11:45 AM

## 2022-10-03 ENCOUNTER — Ambulatory Visit: Admission: RE | Admit: 2022-10-03 | Payer: 59 | Source: Home / Self Care | Admitting: Gastroenterology

## 2022-10-03 HISTORY — DX: Presence of dental prosthetic device (complete) (partial): Z97.2

## 2022-10-03 HISTORY — DX: Headache, unspecified: R51.9

## 2022-10-03 SURGERY — COLONOSCOPY WITH PROPOFOL
Anesthesia: Choice

## 2022-10-17 DIAGNOSIS — I2510 Atherosclerotic heart disease of native coronary artery without angina pectoris: Secondary | ICD-10-CM | POA: Insufficient documentation

## 2022-10-17 NOTE — Progress Notes (Unsigned)
Cardiology Office Note  Date:  10/18/2022   ID:  LOLETTA HARPER, DOB 1956/05/29, MRN [id number]  PCP:  Rubie Maid, MD   Chief Complaint  Patient presents with   4 month follow up      Establish care for CAD follow up NSTEMI s/p cardiac cath 06/06/2022. Patient c/o weakness today. "Doing well." Medications reviewed by the patient verbally.     HPI:  66 year old female with a history of  CAD, non-STEMI hyperlipidemia,  tobacco use , cut back to 1 pp 3 days, 7 a day Who presents for follow-up of her coronary artery disease  Angina at [facility] Initially presented to ED on 06/02/2022 with complaints of chest pain  Workup unremarkable and was discharged home  following morning she woke up with ongoing chest pain and returned to the ED on 06/03/2022. Troponins were elevated.   NSTEMI.   cardiac catheterization which revealed moderate proximal LAD stenosis with DFR of 0.95 suggesting no physiological significance.  Medical management recommended  started on aspirin, Imdur, and statin therapy.  Held for bradycardia  Echocardiogram showed EF 60 to 65%, no RWMA, normal RV function, normal PASP, no significant valvular disease.    Continues to smoke approximately 7 cigarettes a day  her husband died in 10/23/2021,  personal stress at home as well as at her workplace.   Lost son 2 years before that  EKG personally reviewed by myself on todays visit Nsr rate 60 bpm no ST or T wave changes   PMH:   has a past medical history of Anxiety, Depression, Elevated cholesterol, Genital warts (1976), Headache, Menopause syndrome, Myocardial infarction ([place]) (06/02/2022), Vitamin D deficiency, Vulvar lesion, and Wears dentures.  PSH:    Past Surgical History:  Procedure Laterality Date   CESAREAN SECTION  1986   COLONOSCOPY WITH PROPOFOL N/A 05/20/2016   Procedure: COLONOSCOPY WITH PROPOFOL;  Surgeon: Lucilla Lame, MD;  Location: [place];  Service: Endoscopy;  Laterality: N/A;    HERNIA REPAIR     INTRAVASCULAR PRESSURE WIRE/FFR STUDY N/A 06/06/2022   Procedure: INTRAVASCULAR PRESSURE WIRE/FFR STUDY;  Surgeon: Lorretta Harp, MD;  Location: [facility] [facility];  Service: Cardiovascular;  Laterality: N/A;   LEFT HEART CATH AND CORONARY ANGIOGRAPHY N/A 06/06/2022   Procedure: LEFT HEART CATH AND CORONARY ANGIOGRAPHY;  Surgeon: Lorretta Harp, MD;  Location: [place] [facility];  Service: Cardiovascular;  Laterality: N/A;   POLYPECTOMY  05/20/2016   Procedure: POLYPECTOMY;  Surgeon: Lucilla Lame, MD;  Location: [place];  Service: Endoscopy;;   TUBAL LIGATION      Current Outpatient Medications  Medication Sig Dispense Refill   acetaminophen (TYLENOL) 325 MG tablet Take 325 mg by mouth every 4 (four) hours as needed for mild pain.     aspirin EC 81 MG tablet Take 81 mg by mouth daily. Swallow whole.     clonazePAM (KLONOPIN) 1 MG tablet TAKE 1 TABLET BY MOUTH ONCE DAILY AS NEEDED FOR ANXIETY 30 tablet 5   FLUoxetine (PROZAC) 20 MG capsule Take 1 capsule (20 mg total) by mouth daily. 90 capsule 3   nitroGLYCERIN (NITROSTAT) 0.4 MG SL tablet Place 1 tablet (0.4 mg total) under the tongue every 5 (five) minutes as needed for chest pain. 25 tablet 2   Zinc 50 MG TABS Take by mouth daily.     isosorbide mononitrate (IMDUR) 30 MG 24 hr tablet Take 0.5 tablets (15 mg total) by mouth daily. 45 tablet 3  rosuvastatin (CRESTOR) 40 MG tablet Take 1 tablet (40 mg total) by mouth daily. 90 tablet 3   No current facility-administered medications for this visit.     Allergies:   Macrobid [nitrofurantoin monohyd macro], Nitrofurantoin, and Sulfamethoxazole-trimethoprim   Social History:  The patient  reports that she has been smoking cigarettes. She has a 6.25 pack-year smoking history. She has never used smokeless tobacco. She reports that she does not drink alcohol and does not use drugs.   Family History:   family history includes Diabetes in her father and  mother; Heart attack in her father; Heart disease in her mother; Liver disease in her son; Valvular heart disease in her father.    Review of Systems: Review of Systems  Constitutional: Negative.   HENT: Negative.    Respiratory: Negative.    Cardiovascular: Negative.   Gastrointestinal: Negative.   Musculoskeletal: Negative.   Neurological: Negative.   Psychiatric/Behavioral: Negative.    All other systems reviewed and are negative.   PHYSICAL EXAM: VS:  BP 98/60 (BP Location: Left Arm, Patient Position: Sitting, Cuff Size: Normal)   Pulse 60   Ht '5\' 6"'$  (1.676 m)   Wt 138 lb (62.6 kg)   SpO2 98%   BMI 22.27 kg/m  , BMI Body mass index is 22.27 kg/m. GEN: Well nourished, well developed, in no acute distress HEENT: normal Neck: no JVD, carotid bruits, or masses Cardiac: RRR; no murmurs, rubs, or gallops,no edema  Respiratory:  clear to auscultation bilaterally, normal work of breathing GI: soft, nontender, nondistended, + BS MS: no deformity or atrophy Skin: warm and dry, no rash Neuro:  Strength and sensation are intact Psych: euthymic mood, full affect  Recent Labs: 06/02/2022: ALT 11 06/05/2022: Magnesium 1.9 08/25/2022: BUN 13; Creatinine, Ser 0.50; Hemoglobin 11.7; Platelets 230; Potassium 3.8; Sodium 140    Lipid Panel Lab Results  Component Value Date   CHOL 200 06/04/2022   HDL 46 06/04/2022   LDLCALC 142 (H) 06/04/2022   TRIG 62 06/04/2022      Wt Readings from Last 3 Encounters:  10/18/22 138 lb (62.6 kg)  09/21/22 141 lb (64 kg)  08/25/22 140 lb (63.5 kg)     ASSESSMENT AND PLAN:  Problem List Items Addressed This Visit       Cardiology Problems   CAD (coronary artery disease) - Primary   Relevant Medications   isosorbide mononitrate (IMDUR) 30 MG 24 hr tablet   rosuvastatin (CRESTOR) 40 MG tablet   Other Relevant Orders   EKG 12-Lead   Lipid panel   HgB A1c     Other   Dyslipidemia   Relevant Medications   rosuvastatin (CRESTOR) 40  MG tablet   Other Relevant Orders   EKG 12-Lead   Lipid panel   HgB A1c   Tobacco use   Relevant Orders   EKG 12-Lead   Lipid panel   HgB A1c   Pre-diabetes   Relevant Orders   EKG 12-Lead   Lipid panel   HgB A1c   Preop cardiovascular evaluation Acceptable risk for colonoscopy, no further cardiac testing needed  CAd with stable angina Currently with no symptoms of angina. No further workup at this time. Continue current medication regimen. Stressed the importance of smoking cessation  Smoking We have encouraged her to continue to work on weaning her cigarettes and smoking cessation. She will continue to work on this and does not want any assistance with chantix.    Hyperlipidemia Continue Crestor 40 daily, if  numbers above goal may need to add Zetia 10 daily Lipid panel ordered at her convenience   Total encounter time more than 40 minutes  Greater than 50% was spent in counseling and coordination of care with the patient    Signed, Esmond Plants, M.D., Ph.D. [place], [place]

## 2022-10-18 ENCOUNTER — Encounter: Payer: Self-pay | Admitting: Cardiovascular Disease

## 2022-10-18 ENCOUNTER — Ambulatory Visit: Payer: 59 | Attending: Cardiovascular Disease | Admitting: Cardiovascular Disease

## 2022-10-18 ENCOUNTER — Ambulatory Visit: Payer: 59 | Admitting: Cardiovascular Disease

## 2022-10-18 VITALS — BP 98/60 | HR 60 | Ht 66.0 in | Wt 138.0 lb

## 2022-10-18 DIAGNOSIS — I25118 Atherosclerotic heart disease of native coronary artery with other forms of angina pectoris: Secondary | ICD-10-CM | POA: Diagnosis not present

## 2022-10-18 DIAGNOSIS — E785 Hyperlipidemia, unspecified: Secondary | ICD-10-CM | POA: Diagnosis not present

## 2022-10-18 DIAGNOSIS — R7303 Prediabetes: Secondary | ICD-10-CM

## 2022-10-18 DIAGNOSIS — Z720 Tobacco use: Secondary | ICD-10-CM

## 2022-10-18 MED ORDER — ISOSORBIDE MONONITRATE ER 30 MG PO TB24: 15.0000 mg | ORAL_TABLET | Freq: Every day | ORAL | 3 refills | Status: DC

## 2022-10-18 MED ORDER — ROSUVASTATIN CALCIUM 40 MG PO TABS: 40.0000 mg | ORAL_TABLET | Freq: Every day | ORAL | 3 refills | Status: DC

## 2022-10-18 NOTE — Patient Instructions (Addendum)
Medication Instructions:  No changes  If you need a refill on your cardiac medications before your next appointment, please call your pharmacy.   Lab work:  Your physician recommends that you return for lab work @ [facility].  Lipids & HGB A1C- Must be fasting for these labs.  Testing/Procedures: No new testing needed  Follow-Up: At [facility], you and your health needs are our priority.  As part of our continuing mission to provide you with exceptional heart care, we have created designated Provider Care Teams.  These Care Teams include your primary Cardiologist (physician) and Advanced Practice Providers (APPs -  Physician Assistants and Nurse Practitioners) who all work together to provide you with the care you need, when you need it.  You will need a follow up appointment in 12 months  Providers on your designated Care Team:   Murray Hodgkins, NP Christell Faith, PA-C Cadence Kathlen Mody, [place]  COVID-19 Vaccine Information can be found at: [email or web address] For questions related to vaccine distribution or appointments, please email [email or web address] or call [phone].

## 2022-10-20 NOTE — Telephone Encounter (Signed)
   Primary Cardiologist: Quay Burow, MD  Chart reviewed as part of pre-operative protocol coverage. Given past medical history and time since last visit, based on ACC/AHA guidelines, JOHNAE FRILEY would be at acceptable risk for the planned procedure without further cardiovascular testing.   I will route this recommendation to the requesting party via Epic fax function and remove from pre-op pool.  Please call with questions.  Emmaline Life, NP-C  10/20/2022, 7:39 AM [address], [address] Office [phone] Fax [phone]

## 2022-11-09 ENCOUNTER — Encounter: Payer: Self-pay | Admitting: Gastroenterology

## 2022-11-16 ENCOUNTER — Telehealth: Payer: Self-pay | Admitting: Cardiovascular Disease

## 2022-11-16 NOTE — Telephone Encounter (Signed)
   Pre-operative Risk Assessment    Patient Name: Colleen Mcneil  DOB: 06-Nov-1956 MRN: [id number]{      Request for Surgical Clearance    Procedure:   COLONOSCOPY  Date of Surgery:  Clearance TBD                               Surgeon:  NOT INDICATED Surgeon's Group or Practice Name:  [facility] Phone number:  [phone] Fax number:  [phone]  Type of Clearance Requested:   - Medical    Type of Anesthesia:  General    Additional requests/questions:    SignedEli Phillips   11/16/2022, 2:39 PM

## 2022-11-18 NOTE — Anesthesia Preprocedure Evaluation (Signed)
Anesthesia Evaluation  Patient identified by MRN, date of birth, ID band Patient awake    Reviewed: Allergy & Precautions, H&P , NPO status , Patient's Chart, lab work & pertinent test results  Airway Mallampati: II  TM Distance: >3 FB Neck ROM: full    Dental  (+) Upper Dentures   Pulmonary Current Smoker and Patient abstained from smoking.   Pulmonary exam normal        Cardiovascular + CAD and + Past MI (NSTEMI 05/2022)  Normal cardiovascular exam  Per cardiology: cardiac catheterization which revealed moderate proximal LAD stenosis with DFR of 0.95 suggesting no physiological significance.  Medical management recommended  started on aspirin, Imdur, and statin therapy.  Held for bradycardia  Echocardiogram showed EF 60 to 65%, no RWMA, normal RV function, normal PASP, no significant valvular disease.      Neuro/Psych  PSYCHIATRIC DISORDERS Anxiety Depression    negative neurological ROS     GI/Hepatic negative GI ROS, Neg liver ROS,,,  Endo/Other  negative endocrine ROS    Renal/GU negative Renal ROS  negative genitourinary   Musculoskeletal   Abdominal Normal abdominal exam  (+)   Peds  Hematology negative hematology ROS (+)   Anesthesia Other Findings Past Medical History: No date: Anxiety No date: Depression No date: Elevated cholesterol 1976: Genital warts     Comment:  pt was treated with dyphylin No date: Headache     Comment:  medication related daily No date: Menopause syndrome 06/02/2022: Myocardial infarction ([place]) No date: Vitamin D deficiency No date: Vulvar lesion No date: Wears dentures     Comment:  upper  Past Surgical History: 1986: CESAREAN SECTION 05/20/2016: COLONOSCOPY WITH PROPOFOL; N/A     Comment:  Procedure: COLONOSCOPY WITH PROPOFOL;  Surgeon: Lucilla Lame, MD;  Location: [place];  Service:               Endoscopy;  Laterality: N/A; No date: HERNIA  REPAIR 06/06/2022: INTRAVASCULAR PRESSURE WIRE/FFR STUDY; N/A     Comment:  Procedure: INTRAVASCULAR PRESSURE WIRE/FFR STUDY;                Surgeon: Lorretta Harp, MD;  Location: [place] [facility]              [facility];  Service: Cardiovascular;  Laterality: N/A; 06/06/2022: LEFT HEART CATH AND CORONARY ANGIOGRAPHY; N/A     Comment:  Procedure: LEFT HEART CATH AND CORONARY ANGIOGRAPHY;                Surgeon: Lorretta Harp, MD;  Location: [place] [facility]              [facility];  Service: Cardiovascular;  Laterality: N/A; 05/20/2016: POLYPECTOMY     Comment:  Procedure: POLYPECTOMY;  Surgeon: Lucilla Lame, MD;                Location: [place];  Service: Endoscopy;; No date: TUBAL LIGATION  BMI    Body Mass Index: 22.27 kg/m      Reproductive/Obstetrics negative OB ROS                             Anesthesia Physical Anesthesia Plan  ASA: 3  Anesthesia Plan: General   Post-op Pain Management:    Induction:   PONV Risk Score and Plan: Propofol infusion and TIVA  Airway Management Planned:  Natural Airway  Additional Equipment:   Intra-op Plan:   Post-operative Plan:   Informed Consent: I have reviewed the patients History and Physical, chart, labs and discussed the procedure including the risks, benefits and alternatives for the proposed anesthesia with the patient or authorized representative who has indicated his/her understanding and acceptance.     Dental Advisory Given  Plan Discussed with: CRNA and Surgeon  Anesthesia Plan Comments:         Anesthesia Quick Evaluation

## 2022-11-21 ENCOUNTER — Encounter
Admission: RE | Disposition: A | Discharge status: Home / Self Care | Payer: Self-pay | Source: Home / Self Care | Attending: Gastroenterology

## 2022-11-21 ENCOUNTER — Ambulatory Visit
Admission: RE | Admit: 2022-11-21 | Discharge: 2022-11-21 | Disposition: A | Discharge status: Home / Self Care | Payer: 59 | Attending: Gastroenterology | Admitting: Gastroenterology

## 2022-11-21 ENCOUNTER — Ambulatory Visit: Payer: 59 | Admitting: Anesthesiology

## 2022-11-21 ENCOUNTER — Telehealth: Payer: Self-pay | Admitting: Nurse Practitioner

## 2022-11-21 ENCOUNTER — Other Ambulatory Visit: Payer: Self-pay

## 2022-11-21 ENCOUNTER — Telehealth: Payer: Self-pay | Admitting: *Deleted

## 2022-11-21 ENCOUNTER — Encounter: Payer: Self-pay | Admitting: Gastroenterology

## 2022-11-21 DIAGNOSIS — F419 Anxiety disorder, unspecified: Secondary | ICD-10-CM | POA: Diagnosis not present

## 2022-11-21 DIAGNOSIS — I2510 Atherosclerotic heart disease of native coronary artery without angina pectoris: Secondary | ICD-10-CM | POA: Insufficient documentation

## 2022-11-21 DIAGNOSIS — Z8249 Family history of ischemic heart disease and other diseases of the circulatory system: Secondary | ICD-10-CM | POA: Diagnosis not present

## 2022-11-21 DIAGNOSIS — F17210 Nicotine dependence, cigarettes, uncomplicated: Secondary | ICD-10-CM | POA: Insufficient documentation

## 2022-11-21 DIAGNOSIS — K640 First degree hemorrhoids: Secondary | ICD-10-CM | POA: Diagnosis not present

## 2022-11-21 DIAGNOSIS — K635 Polyp of colon: Secondary | ICD-10-CM | POA: Diagnosis not present

## 2022-11-21 DIAGNOSIS — K5730 Diverticulosis of large intestine without perforation or abscess without bleeding: Secondary | ICD-10-CM | POA: Diagnosis not present

## 2022-11-21 DIAGNOSIS — I252 Old myocardial infarction: Secondary | ICD-10-CM | POA: Insufficient documentation

## 2022-11-21 DIAGNOSIS — F32A Depression, unspecified: Secondary | ICD-10-CM | POA: Insufficient documentation

## 2022-11-21 DIAGNOSIS — Z860100 Personal history of colon polyps, unspecified: Secondary | ICD-10-CM

## 2022-11-21 DIAGNOSIS — Z1211 Encounter for screening for malignant neoplasm of colon: Secondary | ICD-10-CM | POA: Diagnosis not present

## 2022-11-21 HISTORY — PX: COLONOSCOPY WITH PROPOFOL: SHX5780

## 2022-11-21 HISTORY — PX: POLYPECTOMY: SHX5525

## 2022-11-21 SURGERY — COLONOSCOPY WITH PROPOFOL
Anesthesia: General | Site: Rectum

## 2022-11-21 MED ORDER — LIDOCAINE HCL (CARDIAC) PF 100 MG/5ML IV SOSY
PREFILLED_SYRINGE | INTRAVENOUS | Status: DC | PRN
  Administered 2022-11-21: 60 mg via INTRAVENOUS

## 2022-11-21 MED ORDER — SODIUM CHLORIDE 0.9 % IV SOLN: INTRAVENOUS | Status: DC

## 2022-11-21 MED ORDER — PROPOFOL 10 MG/ML IV BOLUS
INTRAVENOUS | Status: DC | PRN
  Administered 2022-11-21: 30 mg via INTRAVENOUS
  Administered 2022-11-21: 20 mg via INTRAVENOUS
  Administered 2022-11-21: 90 mg via INTRAVENOUS
  Administered 2022-11-21: 30 mg via INTRAVENOUS

## 2022-11-21 MED ORDER — LACTATED RINGERS IV SOLN: INTRAVENOUS | Status: DC

## 2022-11-21 MED ORDER — STERILE WATER FOR IRRIGATION IR SOLN
Status: DC | PRN
  Administered 2022-11-21: 100 mL

## 2022-11-21 SURGICAL SUPPLY — 8 items
GOWN CVR UNV OPN BCK APRN NK (MISCELLANEOUS) ×2 IMPLANT
GOWN ISOL THUMB LOOP REG UNIV (MISCELLANEOUS) ×2
KIT PRC NS LF DISP ENDO (KITS) ×1 IMPLANT
KIT PROCEDURE OLYMPUS (KITS) ×1
MANIFOLD NEPTUNE II (INSTRUMENTS) ×1 IMPLANT
SNARE COLD EXACTO (MISCELLANEOUS) IMPLANT
TRAP ETRAP POLY (MISCELLANEOUS) IMPLANT
WATER STERILE IRR 250ML POUR (IV SOLUTION) ×1 IMPLANT

## 2022-11-21 NOTE — Transfer of Care (Signed)
Immediate Anesthesia Transfer of Care Note  Patient: Colleen Mcneil  Procedure(s) Performed: COLONOSCOPY WITH BIOPSY (Rectum) POLYPECTOMY (Rectum)  Patient Location: PACU  Anesthesia Type: General  Level of Consciousness: awake, alert  and patient cooperative  Airway and Oxygen Therapy: Patient Spontanous Breathing and Patient connected to supplemental oxygen  Post-op Assessment: Post-op Vital signs reviewed, Patient's Cardiovascular Status Stable, Respiratory Function Stable, Patent Airway and No signs of Nausea or vomiting  Post-op Vital Signs: Reviewed and stable  Complications: No notable events documented.

## 2022-11-21 NOTE — Telephone Encounter (Signed)
Received fax from patient's cardiology this morning that patient is clear for her colonoscopy.  Need to note that patient was clear back in November 2023 for her colonoscopy.  Appreciate the quickness on making sure patient is clear for procedure.

## 2022-11-21 NOTE — Op Note (Signed)
[facility] Patient Name: Colleen Mcneil Procedure Date: 11/21/2022 9:15 AM MRN: [id number] Account #: [id number] Date of Birth: 08/29/1956 Admit Type: Outpatient Age: 67 Room: [facility] OR ROOM 01 Gender: Female Note Status: Finalized Instrument Name: [id number] Procedure:             Colonoscopy Indications:           High risk colon cancer surveillance: Personal history                         of colonic polyps Providers:             Lucilla Lame MD, MD Referring MD:          Chesley Noon. Cherry (Referring MD) Medicines:             Propofol per Anesthesia Complications:         No immediate complications. Procedure:             Pre-Anesthesia Assessment:                        - Prior to the procedure, a History and Physical was                         performed, and patient medications and allergies were                         reviewed. The patient's tolerance of previous                         anesthesia was also reviewed. The risks and benefits                         of the procedure and the sedation options and risks                         were discussed with the patient. All questions were                         answered, and informed consent was obtained. Prior                         Anticoagulants: The patient has taken no anticoagulant                         or antiplatelet agents. ASA Grade Assessment: II - A                         patient with mild systemic disease. After reviewing                         the risks and benefits, the patient was deemed in                         satisfactory condition to undergo the procedure.                        After obtaining informed consent, the colonoscope was  passed under direct vision. Throughout the procedure,                         the patient's blood pressure, pulse, and oxygen                         saturations were monitored continuously. The                          Colonoscope was introduced through the anus and                         advanced to the the cecum, identified by appendiceal                         orifice and ileocecal valve. The colonoscopy was                         performed without difficulty. The patient tolerated                         the procedure well. The quality of the bowel                         preparation was excellent. Findings:      The perianal and digital rectal examinations were normal.      Two sessile polyps were found in the sigmoid colon. The polyps were 3 to       4 mm in size. These polyps were removed with a cold snare. Resection and       retrieval were complete.      Multiple small-mouthed diverticula were found in the sigmoid colon.      Non-bleeding internal hemorrhoids were found during retroflexion. The       hemorrhoids were Grade I (internal hemorrhoids that do not prolapse). Impression:            - Two 3 to 4 mm polyps in the sigmoid colon, removed                         with a cold snare. Resected and retrieved.                        - Diverticulosis in the sigmoid colon.                        - Non-bleeding internal hemorrhoids. Recommendation:        - Discharge patient to home.                        - Resume previous diet.                        - Continue present medications.                        - Await pathology results.                        - Repeat colonoscopy in 5 years for surveillance. Procedure Code(s):     --- Professional ---  45385, Colonoscopy, flexible; with removal of                         tumor(s), polyp(s), or other lesion(s) by snare                         technique Diagnosis Code(s):     --- Professional ---                        Z86.010, Personal history of colonic polyps                        D12.5, Benign neoplasm of sigmoid colon CPT copyright 2022 American Medical Association. All rights reserved. The codes documented in this  report are preliminary and upon coder review may  be revised to meet current compliance requirements. Lucilla Lame MD, MD 11/21/2022 9:43:54 AM This report has been signed electronically. Number of Addenda: 0 Note Initiated On: 11/21/2022 9:15 AM Scope Withdrawal Time: 0 hours 8 minutes 20 seconds  Total Procedure Duration: 0 hours 16 minutes 28 seconds  Estimated Blood Loss:  Estimated blood loss: none.      [facility]

## 2022-11-21 NOTE — Telephone Encounter (Signed)
   Patient Name: Colleen Mcneil  DOB: [birth year] MRN: [id number]  Primary Cardiologist: Quay Burow, MD  Chart reviewed as part of pre-operative protocol coverage. Given past medical history and time since last visit, based on ACC/AHA guidelines, Colleen Mcneil is at acceptable risk for the planned procedure without further cardiovascular testing.   The patient was advised that if she develops new symptoms prior to surgery to contact our office to arrange for a follow-up visit, and she verbalized understanding.  I will route this recommendation to the requesting party via Epic fax function and remove from pre-op pool.  Please call with questions.  Mable Fill, Marissa Nestle, NP 11/21/2022, 7:27 AM

## 2022-11-21 NOTE — Telephone Encounter (Signed)
   Patient Name: Colleen Mcneil  DOB: [birth year] MRN: [id number]  Primary Cardiologist: Quay Burow, MD  Chart reviewed as part of pre-operative protocol coverage. Given past medical history and time since last visit, based on ACC/AHA guidelines, Colleen Mcneil is at acceptable risk for the planned procedure without further cardiovascular testing.    Does not appear to have any medications that need to be held prior to procedure   I will route this recommendation to the requesting party via Epic fax function and remove from pre-op pool.  Please call with questions.  Mable Fill, Marissa Nestle, NP 11/21/2022, 7:25 AM

## 2022-11-21 NOTE — Anesthesia Postprocedure Evaluation (Signed)
Anesthesia Post Note  Patient: Colleen Mcneil  Procedure(s) Performed: COLONOSCOPY WITH BIOPSY (Rectum) POLYPECTOMY (Rectum)  Patient location during evaluation: PACU Anesthesia Type: General Level of consciousness: awake and alert Pain management: pain level controlled Vital Signs Assessment: post-procedure vital signs reviewed and stable Respiratory status: spontaneous breathing, nonlabored ventilation and respiratory function stable Cardiovascular status: blood pressure returned to baseline and stable Postop Assessment: no apparent nausea or vomiting Anesthetic complications: no   No notable events documented.   Last Vitals:  Vitals:   11/21/22 0945 11/21/22 1000  BP: 115/75 (!) 128/93  Pulse: (!) 57 66  Resp: 11 14  Temp: 36.6 C   SpO2: 95% 96%    Last Pain:  Vitals:   11/21/22 1000  TempSrc:   PainSc: 0-No pain                 Iran Ouch

## 2022-11-21 NOTE — H&P (Signed)
Colleen Lame, MD [address] [place], [place] [address] [phone] Fax : [phone]  Primary Care Physician:  Colleen Maid, MD Primary Gastroenterologist:  Dr. Allen Mcneil  Pre-Procedure History & Physical: HPI:  Colleen Mcneil is a 67 y.o. female is here for an colonoscopy.   Past Medical History:  Diagnosis Date   Anxiety    Depression    Elevated cholesterol    Genital warts 1976   pt was treated with dyphylin   Headache    medication related daily   Menopause syndrome    Myocardial infarction ([place]) 06/02/2022   Vitamin D deficiency    Vulvar lesion    Wears dentures    upper    Past Surgical History:  Procedure Laterality Date   CESAREAN SECTION  1986   COLONOSCOPY WITH PROPOFOL N/A 05/20/2016   Procedure: COLONOSCOPY WITH PROPOFOL;  Surgeon: Colleen Lame, MD;  Location: [facility];  Service: Endoscopy;  Laterality: N/A;   HERNIA REPAIR     INTRAVASCULAR PRESSURE WIRE/FFR STUDY N/A 06/06/2022   Procedure: INTRAVASCULAR PRESSURE WIRE/FFR STUDY;  Surgeon: Colleen Harp, MD;  Location: [facility];  Service: Cardiovascular;  Laterality: N/A;   LEFT HEART CATH AND CORONARY ANGIOGRAPHY N/A 06/06/2022   Procedure: LEFT HEART CATH AND CORONARY ANGIOGRAPHY;  Surgeon: Colleen Harp, MD;  Location: [facility];  Service: Cardiovascular;  Laterality: N/A;   POLYPECTOMY  05/20/2016   Procedure: POLYPECTOMY;  Surgeon: Colleen Lame, MD;  Location: [facility];  Service: Endoscopy;;   TUBAL LIGATION      Prior to Admission medications   Medication Sig Start Date End Date Taking? Authorizing Provider  acetaminophen (TYLENOL) 325 MG tablet Take 325 mg by mouth every 4 (four) hours as needed for mild pain. 10/09/19   [provider]  aspirin EC 81 MG tablet Take 81 mg by mouth daily. Swallow whole.    [provider]  clonazePAM (KLONOPIN) 1 MG tablet TAKE 1 TABLET BY MOUTH ONCE DAILY AS NEEDED FOR ANXIETY  09/21/22   Colleen Maid, MD  FLUoxetine (PROZAC) 20 MG capsule Take 1 capsule (20 mg total) by mouth daily. 09/21/22   Colleen Maid, MD  isosorbide mononitrate (IMDUR) 30 MG 24 hr tablet Take 0.5 tablets (15 mg total) by mouth daily. 10/18/22   Colleen Merritts, MD  nitroGLYCERIN (NITROSTAT) 0.4 MG SL tablet Place 1 tablet (0.4 mg total) under the tongue every 5 (five) minutes as needed for chest pain. 06/06/22   Colleen Manly, NP  rosuvastatin (CRESTOR) 40 MG tablet Take 1 tablet (40 mg total) by mouth daily. 10/18/22   Colleen Merritts, MD  Zinc 50 MG TABS Take by mouth daily.    [provider]    Allergies as of 09/27/2022 - Review Complete 09/21/2022  Allergen Reaction Noted   Macrobid [nitrofurantoin monohyd macro] Nausea And Vomiting 08/06/2015   Nitrofurantoin Nausea And Vomiting 08/06/2015   Sulfamethoxazole-trimethoprim  11/20/2019    Family History  Problem Relation Age of Onset   Heart disease Mother    Diabetes Mother    Heart attack Father    Diabetes Father    Valvular heart disease Father    Liver disease Son    Breast cancer Neg Hx     Social History   Socioeconomic History   Marital status: Widowed    Spouse name: Not on file   Number of children: Not on file   Years of education: Not  on file   Highest education level: Not on file  Occupational History   Not on file  Tobacco Use   Smoking status: Every Day    Packs/day: 0.25    Years: 40.00    Total pack years: 10.00    Types: Cigarettes   Smokeless tobacco: Never  Vaping Use   Vaping Use: Never used  Substance and Sexual Activity   Alcohol use: No   Drug use: No   Sexual activity: Not Currently  Other Topics Concern   Not on file  Social History Narrative   Not on file   Social Determinants of Health   Financial Resource Strain: Not on file  Food Insecurity: Not on file  Transportation Needs: Not on file  Physical Activity: Not on file  Stress: Not on file  Social  Connections: Not on file  Intimate Partner Violence: Not on file    Review of Systems: See HPI, otherwise negative ROS  Physical Exam: Ht '5\' 6"'$  (1.676 m)   Wt 62.6 kg   BMI 22.27 kg/m  General:   Alert,  pleasant and cooperative in NAD Head:  Normocephalic and atraumatic. Neck:  Supple; no masses or thyromegaly. Lungs:  Clear throughout to auscultation.    Heart:  Regular rate and rhythm. Abdomen:  Soft, nontender and nondistended. Normal bowel sounds, without guarding, and without rebound.   Neurologic:  Alert and  oriented x4;  grossly normal neurologically.  Impression/Plan: Colleen Mcneil is here for an colonoscopy to be performed for a history of adenomatous polyps on 2017   Risks, benefits, limitations, and alternatives regarding  colonoscopy have been reviewed with the patient.  Questions have been answered.  All parties agreeable.   Colleen Lame, MD  11/21/2022, 8:44 AM

## 2022-11-22 ENCOUNTER — Encounter: Payer: Self-pay | Admitting: Gastroenterology

## 2022-11-22 LAB — SURGICAL PATHOLOGY

## 2022-12-12 ENCOUNTER — Other Ambulatory Visit
Admission: RE | Admit: 2022-12-12 | Discharge: 2022-12-12 | Disposition: A | Discharge status: Home / Self Care | Payer: 59 | Attending: Cardiovascular Disease | Admitting: Cardiovascular Disease

## 2022-12-12 DIAGNOSIS — I25118 Atherosclerotic heart disease of native coronary artery with other forms of angina pectoris: Secondary | ICD-10-CM | POA: Diagnosis not present

## 2022-12-12 DIAGNOSIS — R7303 Prediabetes: Secondary | ICD-10-CM | POA: Insufficient documentation

## 2022-12-12 DIAGNOSIS — Z720 Tobacco use: Secondary | ICD-10-CM | POA: Diagnosis present

## 2022-12-12 DIAGNOSIS — E785 Hyperlipidemia, unspecified: Secondary | ICD-10-CM | POA: Insufficient documentation

## 2022-12-12 LAB — LIPID PANEL
Cholesterol: 131 mg/dL (ref 0–200)
HDL: 48 mg/dL (ref >40)
LDL Cholesterol: 73 mg/dL (ref 0–99)
Total CHOL/HDL Ratio: 2.7 RATIO
Triglycerides: 51 mg/dL (ref <150)
VLDL: 10 mg/dL (ref 0–40)

## 2022-12-12 LAB — HEMOGLOBIN A1C
Hgb A1c MFr Bld: 6.1 % — ABNORMAL HIGH (ref 4.8–5.6)
Mean Plasma Glucose: 128.37 mg/dL

## 2023-03-19 ENCOUNTER — Other Ambulatory Visit: Payer: Self-pay | Admitting: Obstetrics and Gynecology

## 2023-09-05 ENCOUNTER — Telehealth: Payer: Self-pay | Admitting: Obstetrics and Gynecology

## 2023-09-05 NOTE — Telephone Encounter (Addendum)
The patient is requesting medication refill on clonazePAM (KLONOPIN) 1 MG tablet  and FLUoxetine (PROZAC) 20 MG capsule. The patient was requesting to schedule annual exam, we were unable to offer date/ time with Dr Valentino Saxon. The patient has been added to cancellation/ waitlist to contact patient for scheduling. January scheduled hasn't been released yet. Please advise?

## 2023-09-06 MED ORDER — CLONAZEPAM 1 MG PO TABS: ORAL_TABLET | ORAL | 5 refills | Status: DC

## 2023-09-06 MED ORDER — FLUOXETINE HCL 20 MG PO CAPS: 20.0000 mg | ORAL_CAPSULE | Freq: Every day | ORAL | 3 refills | Status: AC

## 2023-09-06 NOTE — Telephone Encounter (Signed)
Contacted the patient vis phone. She has confirmed 11/26 with Dr. Valentino Saxon

## 2023-09-06 NOTE — Telephone Encounter (Signed)
She can be scheduled in November on the week of Thanksgiving in a colpo spot.  I'll refill her meds until then.

## 2023-10-02 NOTE — Patient Instructions (Incomplete)
Preventive Care 22 Years and Older, Female Preventive care refers to lifestyle choices and visits with your health care provider that can promote health and wellness. Preventive care visits are also called wellness exams. What can I expect for my preventive care visit? Counseling Your health care provider may ask you questions about your: Medical history, including: Past medical problems. Family medical history. Pregnancy and menstrual history. History of falls. Current health, including: Memory and ability to understand (cognition). Emotional well-being. Home life and relationship well-being. Sexual activity and sexual health. Lifestyle, including: Alcohol, nicotine or tobacco, and drug use. Access to firearms. Diet, exercise, and sleep habits. Work and work Astronomer. Sunscreen use. Safety issues such as seatbelt and bike helmet use. Physical exam Your health care provider will check your: Height and weight. These may be used to calculate your BMI (body mass index). BMI is a measurement that tells if you are at a healthy weight. Waist circumference. This measures the distance around your waistline. This measurement also tells if you are at a healthy weight and may help predict your risk of certain diseases, such as type 2 diabetes and high blood pressure. Heart rate and blood pressure. Body temperature. Skin for abnormal spots. What immunizations do I need?  Vaccines are usually given at various ages, according to a schedule. Your health care provider will recommend vaccines for you based on your age, medical history, and lifestyle or other factors, such as travel or where you work. What tests do I need? Screening Your health care provider may recommend screening tests for certain conditions. This may include: Lipid and cholesterol levels. Hepatitis C test. Hepatitis B test. HIV (human immunodeficiency virus) test. STI (sexually transmitted infection) testing, if you are at  risk. Lung cancer screening. Colorectal cancer screening. Diabetes screening. This is done by checking your blood sugar (glucose) after you have not eaten for a while (fasting). Mammogram. Talk with your health care provider about how often you should have regular mammograms. BRCA-related cancer screening. This may be done if you have a family history of breast, ovarian, tubal, or peritoneal cancers. Bone density scan. This is done to screen for osteoporosis. Talk with your health care provider about your test results, treatment options, and if necessary, the need for more tests. Follow these instructions at home: Eating and drinking  Eat a diet that includes fresh fruits and vegetables, whole grains, lean protein, and low-fat dairy products. Limit your intake of foods with high amounts of sugar, saturated fats, and salt. Take vitamin and mineral supplements as recommended by your health care provider. Do not drink alcohol if your health care provider tells you not to drink. If you drink alcohol: Limit how much you have to 0-1 drink a day. Know how much alcohol is in your drink. In the [place] one drink equals one 12 oz bottle of beer (355 mL), one 5 oz glass of wine (148 mL), or one 1 oz glass of hard liquor (44 mL). Lifestyle Brush your teeth every morning and night with fluoride toothpaste. Floss one time each day. Exercise for at least 30 minutes 5 or more days each week. Do not use any products that contain nicotine or tobacco. These products include cigarettes, chewing tobacco, and vaping devices, such as e-cigarettes. If you need help quitting, ask your health care provider. Do not use drugs. If you are sexually active, practice safe sex. Use a condom or other form of protection in order to prevent STIs. Take aspirin only as told by  your health care provider. Make sure that you understand how much to take and what form to take. Work with your health care provider to find out whether it  is safe and beneficial for you to take aspirin daily. Ask your health care provider if you need to take a cholesterol-lowering medicine (statin). Find healthy ways to manage stress, such as: Meditation, yoga, or listening to music. Journaling. Talking to a trusted person. Spending time with friends and family. Minimize exposure to UV radiation to reduce your risk of skin cancer. Safety Always wear your seat belt while driving or riding in a vehicle. Do not drive: If you have been drinking alcohol. Do not ride with someone who has been drinking. When you are tired or distracted. While texting. If you have been using any mind-altering substances or drugs. Wear a helmet and other protective equipment during sports activities. If you have firearms in your house, make sure you follow all gun safety procedures. What's next? Visit your health care provider once a year for an annual wellness visit. Ask your health care provider how often you should have your eyes and teeth checked. Stay up to date on all vaccines. This information is not intended to replace advice given to you by your health care provider. Make sure you discuss any questions you have with your health care provider. Document Revised: 04/21/2021 Document Reviewed: 04/21/2021 Elsevier Patient Education  2024 [employer] Breast Self-Awareness Breast self-awareness is knowing how your breasts look and feel. You need to: Check your breasts on a regular basis. Tell your doctor about any changes. Become familiar with the look and feel of your breasts. This can help you catch a breast problem while it is still small and can be treated. You should do breast self-exams even if you have breast implants. What you need: A mirror. A well-lit room. A pillow or other soft object. How to do a breast self-exam Follow these steps to do a breast self-exam: Look for changes  Take off all the clothes above your waist. Stand in front of a  mirror in a room with good lighting. Put your hands down at your sides. Compare your breasts in the mirror. Look for any difference between them, such as: A difference in shape. A difference in size. Wrinkles, dips, and bumps in one breast and not the other. Look at each breast for changes in the skin, such as: Redness. Scaly areas. Skin that has gotten thicker. Dimpling. Open sores (ulcers). Look for changes in your nipples, such as: Fluid coming out of a nipple. Fluid around a nipple. Bleeding. Dimpling. Redness. A nipple that looks pushed in (retracted), or that has changed position. Feel for changes Lie on your back. Feel each breast. To do this: Pick a breast to feel. Place a pillow under the shoulder closest to that breast. Put the arm closest to that breast behind your head. Feel the nipple area of that breast using the hand of your other arm. Feel the area with the pads of your three middle fingers by making small circles with your fingers. Use light, medium, and firm pressure. Continue the overlapping circles, moving downward over the breast. Keep making circles with your fingers. Stop when you feel your ribs. Start making circles with your fingers again, this time going upward until you reach your collarbone. Then, make circles outward across your breast and into your armpit area. Squeeze your nipple. Check for discharge and lumps. Repeat these steps to check your other  breast. Sit or stand in the tub or shower. With soapy water on your skin, feel each breast the same way you did when you were lying down. Write down what you find Writing down what you find can help you remember what to tell your doctor. Write down: What is normal for each breast. Any changes you find in each breast. These include: The kind of changes you find. A tender or painful breast. Any lump you find. Write down its size and where it is. When you last had your monthly period (menstrual  cycle). General tips If you are breastfeeding, the best time to check your breasts is after you feed your baby or after you use a breast pump. If you get monthly bleeding, the best time to check your breasts is 5-7 days after your monthly cycle ends. With time, you will become comfortable with the self-exam. You will also start to know if there are changes in your breasts. Contact a doctor if: You see a change in the shape or size of your breasts or nipples. You see a change in the skin of your breast or nipples, such as red or scaly skin. You have fluid coming from your nipples that is not normal. You find a new lump or thick area. You have breast pain. You have any concerns about your breast health. Summary Breast self-awareness includes looking for changes in your breasts and feeling for changes within your breasts. You should do breast self-awareness in front of a mirror in a well-lit room. If you get monthly periods (menstrual cycles), the best time to check your breasts is 5-7 days after your period ends. Tell your doctor about any changes you see in your breasts. Changes include changes in size, changes on the skin, painful or tender breasts, or fluid from your nipples that is not normal. This information is not intended to replace advice given to you by your health care provider. Make sure you discuss any questions you have with your health care provider. Document Revised: 03/31/2022 Document Reviewed: 08/26/2021 Elsevier Patient Education  2024 [employer].

## 2023-10-02 NOTE — Progress Notes (Unsigned)
ANNUAL PREVENTATIVE CARE GYNECOLOGY  ENCOUNTER NOTE  Subjective:       Colleen Mcneil is a 67 y.o. [phone] female here for a routine annual gynecologic exam.  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 The patient is not sexually active. The patient has never taken hormone replacement therapy. Patient denies post-menopausal vaginal bleeding. The patient wears seatbelts: yes. The patient participates in regular exercise: reports working out in the yard.    Current complaints: 1.  ***    Gynecologic History No LMP recorded. Patient is postmenopausal. Contraception: post menopausal status Last Pap: 06/03/2020. Results were: normal Last mammogram: 10/01/2022. Results were: normal Last Colonoscopy: 09/22/2023: 5 years Last Dexa Scan: 08/24/2018. Osteopenia.  T score is -1.8.     Obstetric History OB History  Gravida Para Term Preterm AB Living  3 3 2 1   3   SAB IAB Ectopic Multiple Live Births          3    # Outcome Date GA Lbr Len/2nd Weight Sex Type Anes PTL Lv  3 Preterm 32    M CS-Unspec   LIV  2 Term 61    F Vag-Spont   LIV  1 Term 53    M Vag-Spont   LIV    Past Medical History:  Diagnosis Date   Anxiety    Depression    Elevated cholesterol    Genital warts 1976   pt was treated with dyphylin   Headache    medication related daily   Menopause syndrome    Myocardial infarction (HCC) 06/02/2022   Vitamin D deficiency    Vulvar lesion    Wears dentures    upper    Family History  Problem Relation Age of Onset   Heart disease Mother    Diabetes Mother    Heart attack Father    Diabetes Father    Valvular heart disease Father    Liver disease Son    Breast cancer Neg Hx     Past Surgical History:  Procedure Laterality Date   CESAREAN SECTION  1986   COLONOSCOPY WITH PROPOFOL N/A 05/20/2016   Procedure: COLONOSCOPY WITH PROPOFOL;  Surgeon: Midge Minium, MD;  Location: [facility];  Service:  Endoscopy;  Laterality: N/A;   COLONOSCOPY WITH PROPOFOL N/A 11/21/2022   Procedure: COLONOSCOPY WITH BIOPSY;  Surgeon: Midge Minium, MD;  Location: [facility];  Service: Endoscopy;  Laterality: N/A;   CORONARY PRESSURE/FFR STUDY N/A 06/06/2022   Procedure: INTRAVASCULAR PRESSURE WIRE/FFR STUDY;  Surgeon: Runell Gess, MD;  [facility]: [facility];  Service: Cardiovascular;  Laterality: N/A;   HERNIA REPAIR     LEFT HEART CATH AND CORONARY ANGIOGRAPHY N/A 06/06/2022   Procedure: LEFT HEART CATH AND CORONARY ANGIOGRAPHY;  Surgeon: Runell Gess, MD;  Location: [facility];  Service: Cardiovascular;  Laterality: N/A;   POLYPECTOMY  05/20/2016   Procedure: POLYPECTOMY;  Surgeon: Midge Minium, MD;  Location: [facility];  Service: Endoscopy;;   POLYPECTOMY N/A 11/21/2022   Procedure: POLYPECTOMY;  Surgeon: Midge Minium, MD;  Location: [facility];  Service: Endoscopy;  Laterality: N/A;   TUBAL LIGATION      Social History   Socioeconomic History   Marital status: Widowed    Spouse name: Not on file   Number of children: Not on file   Years of education: Not on file   Highest education level: Not on file  Occupational History   Not on file  Tobacco Use   Smoking status: Every Day    Current packs/day: 0.25    Average packs/day: 0.3 packs/day for 40.0 years (10.0 ttl pk-yrs)    Types: Cigarettes   Smokeless tobacco: Never  Vaping Use   Vaping status: Never Used  Substance and Sexual Activity   Alcohol use: No   Drug use: No   Sexual activity: Not Currently  Other Topics Concern   Not on file  Social History Narrative   Not on file   Social Determinants of Health   Financial Resource Strain: Not on file  Food Insecurity: Not on file  Transportation Needs: Not on file  Physical Activity: Not on file  Stress: Not on file  Social Connections: Not on file  Intimate Partner Violence: Not on file    Current Outpatient Medications on File  Prior to Visit  Medication Sig Dispense Refill   acetaminophen (TYLENOL) 325 MG tablet Take 325 mg by mouth every 4 (four) hours as needed for mild pain.     aspirin EC 81 MG tablet Take 81 mg by mouth daily. Swallow whole.     clonazePAM (KLONOPIN) 1 MG tablet TAKE ONE TABLET BY MOUTH ONCE DAILY AS NEEDED FOR ANXIETY 30 tablet 5   FLUoxetine (PROZAC) 20 MG capsule Take 1 capsule (20 mg total) by mouth daily. 90 capsule 3   isosorbide mononitrate (IMDUR) 30 MG 24 hr tablet Take 0.5 tablets (15 mg total) by mouth daily. 45 tablet 3   nitroGLYCERIN (NITROSTAT) 0.4 MG SL tablet Place 1 tablet (0.4 mg total) under the tongue every 5 (five) minutes as needed for chest pain. 25 tablet 2   rosuvastatin (CRESTOR) 40 MG tablet Take 1 tablet (40 mg total) by mouth daily. 90 tablet 3   Zinc 50 MG TABS Take by mouth daily.     No current facility-administered medications on file prior to visit.    Allergies  Allergen Reactions   Macrobid [Nitrofurantoin Monohyd Macro] Nausea And Vomiting   Nitrofurantoin Nausea And Vomiting   Sulfamethoxazole-Trimethoprim     unknown      Review of Systems ROS Review of Systems - General ROS: negative for - chills, fatigue, fever, hot flashes, night sweats, weight gain or weight loss Psychological ROS: negative for - anxiety, decreased libido, depression, mood swings, physical abuse or sexual abuse Ophthalmic ROS: negative for - blurry vision, eye pain or loss of vision ENT ROS: negative for - headaches, hearing change, visual changes or vocal changes Allergy and Immunology ROS: negative for - hives, itchy/watery eyes or seasonal allergies Hematological and Lymphatic ROS: negative for - bleeding problems, bruising, swollen lymph nodes or weight loss Endocrine ROS: negative for - galactorrhea, hair pattern changes, hot flashes, malaise/lethargy, mood swings, palpitations, polydipsia/polyuria, skin changes, temperature intolerance or unexpected weight  changes Breast ROS: negative for - new or changing breast lumps or nipple discharge Respiratory ROS: negative for - cough or shortness of breath Cardiovascular ROS: negative for - chest pain, irregular heartbeat, palpitations or shortness of breath Gastrointestinal ROS: no abdominal pain, change in bowel habits, or black or bloody stools Genito-Urinary ROS: no dysuria, trouble voiding, or hematuria Musculoskeletal ROS: negative for - joint pain or joint stiffness Neurological ROS: negative for - bowel and bladder control changes Dermatological ROS: negative for rash and skin lesion changes   Objective:   There were no vitals taken for this visit. CONSTITUTIONAL: Well-developed, well-nourished female in no acute distress.  PSYCHIATRIC:  Normal mood and affect. Normal behavior. Normal judgment and thought content. NEUROLGIC: Alert and oriented to person, place, and time. Normal muscle tone coordination. No cranial nerve deficit noted. HENT:  Normocephalic, atraumatic, External right and left ear normal. Oropharynx is clear and moist EYES: Conjunctivae and EOM are normal. Pupils are equal, round, and reactive to light. No scleral icterus.  NECK: Normal range of motion, supple, no masses.  Normal thyroid.  SKIN: Skin is warm and dry. No rash noted. Not diaphoretic. No erythema. No pallor. CARDIOVASCULAR: Normal heart rate noted, regular rhythm, no murmur. RESPIRATORY: Clear to auscultation bilaterally. Effort and breath sounds normal, no problems with respiration noted. BREASTS: Symmetric in size. No masses, skin changes, nipple drainage, or lymphadenopathy. ABDOMEN: Soft, normal bowel sounds, no distention noted.  No tenderness, rebound or guarding.  BLADDER: Normal PELVIC:  Bladder {:311640}  Urethra: {:311719}  Vulva: {:311722}  Vagina: {:311643}  Cervix: {:311644}  Uterus: {:311718}  Adnexa: {:311645}  RV: {Blank multiple:19196::"External Exam NormaI","No Rectal Masses","Normal  Sphincter tone"}  MUSCULOSKELETAL: Normal range of motion. No tenderness.  No cyanosis, clubbing, or edema.  2+ distal pulses. LYMPHATIC: No Axillary, Supraclavicular, or Inguinal Adenopathy.   Labs: Lab Results  Component Value Date   WBC 5.6 08/25/2022   HGB 11.7 (L) 08/25/2022   HCT 35.5 (L) 08/25/2022   MCV 92.7 08/25/2022   PLT 230 08/25/2022    Lab Results  Component Value Date   CREATININE 0.50 08/25/2022   BUN 13 08/25/2022   NA 140 08/25/2022   K 3.8 08/25/2022   CL 107 08/25/2022   CO2 27 08/25/2022    Lab Results  Component Value Date   ALT 11 06/02/2022   AST 16 06/02/2022   ALKPHOS 45 06/02/2022   BILITOT 0.4 06/02/2022    Lab Results  Component Value Date   CHOL 131 12/12/2022   HDL 48 12/12/2022   LDLCALC 73 12/12/2022   TRIG 51 12/12/2022   CHOLHDL 2.7 12/12/2022    Lab Results  Component Value Date   TSH 2.360 08/09/2019    Lab Results  Component Value Date   HGBA1C 6.1 (H) 12/12/2022     Assessment:   1. Encounter for well woman exam with routine gynecological exam   2. Prediabetes   3. Dyslipidemia   4. History of vitamin D deficiency   5. Encounter for screening mammogram for malignant neoplasm of breast      Plan:  Pap: Not needed Mammogram: Ordered Colon Screening:   UTD Labs:  Pending Routine preventative health maintenance measures emphasized:  Self Breast Exams, Exercise/Diet/Weight control, and Stress Management Flu vaccine status: COVID Vaccination status: Return to Clinic - 1 Year      Hildred Laser, MD [facility]

## 2023-10-03 ENCOUNTER — Encounter: Payer: Self-pay | Admitting: Obstetrics and Gynecology

## 2023-10-03 ENCOUNTER — Ambulatory Visit (INDEPENDENT_AMBULATORY_CARE_PROVIDER_SITE_OTHER): Payer: 59 | Admitting: Obstetrics and Gynecology

## 2023-10-03 ENCOUNTER — Other Ambulatory Visit: Payer: Self-pay | Admitting: Obstetrics and Gynecology

## 2023-10-03 VITALS — BP 124/82 | HR 57 | Ht 66.0 in | Wt 139.8 lb

## 2023-10-03 DIAGNOSIS — Z1231 Encounter for screening mammogram for malignant neoplasm of breast: Secondary | ICD-10-CM

## 2023-10-03 DIAGNOSIS — R7303 Prediabetes: Secondary | ICD-10-CM

## 2023-10-03 DIAGNOSIS — M545 Low back pain, unspecified: Secondary | ICD-10-CM

## 2023-10-03 DIAGNOSIS — N8110 Cystocele, unspecified: Secondary | ICD-10-CM

## 2023-10-03 DIAGNOSIS — F32A Depression, unspecified: Secondary | ICD-10-CM

## 2023-10-03 DIAGNOSIS — N393 Stress incontinence (female) (male): Secondary | ICD-10-CM

## 2023-10-03 DIAGNOSIS — Z01419 Encounter for gynecological examination (general) (routine) without abnormal findings: Secondary | ICD-10-CM

## 2023-10-03 DIAGNOSIS — M8588 Other specified disorders of bone density and structure, other site: Secondary | ICD-10-CM

## 2023-10-03 DIAGNOSIS — Z8639 Personal history of other endocrine, nutritional and metabolic disease: Secondary | ICD-10-CM

## 2023-10-03 DIAGNOSIS — E785 Hyperlipidemia, unspecified: Secondary | ICD-10-CM

## 2023-10-03 MED ORDER — CYCLOBENZAPRINE HCL 10 MG PO TABS: 10.0000 mg | ORAL_TABLET | Freq: Three times a day (TID) | ORAL | 1 refills | Status: AC | PRN

## 2023-10-04 ENCOUNTER — Encounter: Payer: Self-pay | Admitting: Obstetrics and Gynecology

## 2023-10-04 LAB — CBC
Hematocrit: 39.7 % (ref 34.0–46.6)
Hemoglobin: 12.9 g/dL (ref 11.1–15.9)
MCH: 30.3 pg (ref 26.6–33.0)
MCHC: 32.5 g/dL (ref 31.5–35.7)
MCV: 93 fL (ref 79–97)
Platelets: 241 10*3/uL (ref 150–450)
RBC: 4.26 x10E6/uL (ref 3.77–5.28)
RDW: 11.9 % (ref 11.7–15.4)
WBC: 5.1 10*3/uL (ref 3.4–10.8)

## 2023-10-04 LAB — COMPREHENSIVE METABOLIC PANEL
ALT: 17 [IU]/L (ref 0–32)
AST: 18 [IU]/L (ref 0–40)
Albumin: 4.4 g/dL (ref 3.9–4.9)
Alkaline Phosphatase: 55 [IU]/L (ref 44–121)
BUN/Creatinine Ratio: 22 (ref 12–28)
BUN: 13 mg/dL (ref 8–27)
Bilirubin Total: 0.3 mg/dL (ref 0.0–1.2)
CO2: 23 mmol/L (ref 20–29)
Calcium: 9.1 mg/dL (ref 8.7–10.3)
Chloride: 105 mmol/L (ref 96–106)
Creatinine, Ser: 0.58 mg/dL (ref 0.57–1.00)
Globulin, Total: 2 g/dL (ref 1.5–4.5)
Glucose: 93 mg/dL (ref 70–99)
Potassium: 4.2 mmol/L (ref 3.5–5.2)
Sodium: 142 mmol/L (ref 134–144)
Total Protein: 6.4 g/dL (ref 6.0–8.5)
eGFR: 99 mL/min/{1.73_m2} (ref >59)

## 2023-10-04 LAB — LIPID PANEL
Chol/HDL Ratio: 2.8 {ratio} (ref 0.0–4.4)
Cholesterol, Total: 149 mg/dL (ref 100–199)
HDL: 53 mg/dL (ref >39)
LDL Chol Calc (NIH): 80 mg/dL (ref 0–99)
Triglycerides: 82 mg/dL (ref 0–149)
VLDL Cholesterol Cal: 16 mg/dL (ref 5–40)

## 2023-10-04 LAB — TSH: TSH: 2.93 u[IU]/mL (ref 0.450–4.500)

## 2023-10-04 LAB — HEMOGLOBIN A1C
Est. average glucose Bld gHb Est-mCnc: 134 mg/dL
Hgb A1c MFr Bld: 6.3 % — ABNORMAL HIGH (ref 4.8–5.6)

## 2023-10-04 LAB — VITAMIN D 25 HYDROXY (VIT D DEFICIENCY, FRACTURES): Vit D, 25-Hydroxy: 35.3 ng/mL (ref 30.0–100.0)

## 2023-11-09 ENCOUNTER — Other Ambulatory Visit: Payer: Self-pay | Admitting: Cardiovascular Disease

## 2024-01-07 NOTE — Progress Notes (Unsigned)
 Cardiology Office Note  Date:  01/08/2024   ID:  Colleen Mcneil, DOB Jan 17, 1956, MRN [id number]  PCP:  Hildred Laser, MD   Chief Complaint  Patient presents with   12 month follow up     Patient c/o headaches daily due to the Imdur.     HPI:  67 year old female with a history of  CAD, non-STEMI hyperlipidemia,  tobacco use , cut back to 1 pp 3 days, 7 a day Non-STEMI July 2023 Ejection fraction 60 to 65% Catheterization July 2023 moderate proximal LAD stenosis Who presents for follow-up of her coronary artery disease  Last seen by myself in clinic 10-19-2022 Still smoking, down to 5 a day Continues to work, hoping to work through age 75 Works at [employer] has been there over 30 years  Denies significant chest pain or shortness of breath on exertion Reports isosorbide continues to give her a headache daily even half a pill Denies recent anginal symptoms  her husband died in 10-19-21 Lost son 2 years before that  Lab work reviewed A1c 6.3 Total cholesterol 149 LDL 80  EKG personally reviewed by myself on todays visit EKG Interpretation Date/Time:  Monday January 08 2024 08:10:55 EST Ventricular Rate:  62 PR Interval:  130 QRS Duration:  90 QT Interval:  424 QTC Calculation: 430 R Axis:   -15  Text Interpretation: Normal sinus rhythm Possible Left atrial enlargement When compared with ECG of 06-Jun-2022 08:38, Vent. rate has increased BY  20 BPM Questionable change in QRS axis Confirmed by Julien Nordmann [phone]) on 01/08/2024 8:16:19 AM   Of the past medical history reviewed Angina at [facility] Initially presented to ED on 06/02/2022 with complaints of chest pain  Workup unremarkable and was discharged home  following morning she woke up with ongoing chest pain and returned to the ED on 06/03/2022. Troponins were elevated.   NSTEMI.   cardiac catheterization which revealed moderate proximal LAD stenosis with DFR of 0.95 suggesting no  physiological significance.  Medical management recommended  started on aspirin, Imdur, and statin therapy.  Held for bradycardia  Echocardiogram showed EF 60 to 65%, no RWMA, normal RV function, normal PASP, no significant valvular disease.    PMH:   has a past medical history of Anxiety, Depression, Elevated cholesterol, Genital warts (1976), Headache, Menopause syndrome, Myocardial infarction (HCC) (06/02/2022), Vitamin D deficiency, Vulvar lesion, and Wears dentures.  PSH:    Past Surgical History:  Procedure Laterality Date   CESAREAN SECTION  1986   COLONOSCOPY WITH PROPOFOL N/A 05/20/2016   Procedure: COLONOSCOPY WITH PROPOFOL;  Surgeon: Midge Minium, MD;  Location: [facility];  Service: Endoscopy;  Laterality: N/A;   COLONOSCOPY WITH PROPOFOL N/A 11/21/2022   Procedure: COLONOSCOPY WITH BIOPSY;  Surgeon: Midge Minium, MD;  Location: [facility];  Service: Endoscopy;  Laterality: N/A;   CORONARY PRESSURE/FFR STUDY N/A 06/06/2022   Procedure: INTRAVASCULAR PRESSURE WIRE/FFR STUDY;  Surgeon: Runell Gess, MD;  [facility];  Service: Cardiovascular;  Laterality: N/A;   HERNIA REPAIR     LEFT HEART CATH AND CORONARY ANGIOGRAPHY N/A 06/06/2022   Procedure: LEFT HEART CATH AND CORONARY ANGIOGRAPHY;  Surgeon: Runell Gess, MD;  [facility]: [facility];  Service: Cardiovascular;  Laterality: N/A;   POLYPECTOMY  05/20/2016   Procedure: POLYPECTOMY;  Surgeon: Midge Minium, MD;  Location: [facility];  Service: Endoscopy;;   POLYPECTOMY N/A 11/21/2022   Procedure: POLYPECTOMY;  Surgeon: Servando Snare,  Darren, MD;  Location: [facility];  Service: Endoscopy;  Laterality: N/A;   TUBAL LIGATION      Current Outpatient Medications  Medication Sig Dispense Refill   acetaminophen (TYLENOL) 325 MG tablet Take 325 mg by mouth every 4 (four) hours as needed for mild pain.     aspirin EC 81 MG tablet Take 81 mg by mouth daily. Swallow whole.      clonazePAM (KLONOPIN) 1 MG tablet TAKE ONE TABLET BY MOUTH ONCE DAILY AS NEEDED FOR ANXIETY 30 tablet 5   FLUoxetine (PROZAC) 20 MG capsule Take 1 capsule (20 mg total) by mouth daily. 90 capsule 3   rosuvastatin (CRESTOR) 40 MG tablet TAKE ONE TABLET BY MOUTH EVERY DAY 30 tablet 0   Zinc 50 MG TABS Take by mouth daily.     cyclobenzaprine (FLEXERIL) 10 MG tablet Take 1 tablet (10 mg total) by mouth 3 (three) times daily as needed for muscle spasms. (Patient not taking: Reported on 01/08/2024) 30 tablet 1   nitroGLYCERIN (NITROSTAT) 0.4 MG SL tablet Place 1 tablet (0.4 mg total) under the tongue every 5 (five) minutes as needed for chest pain. 25 tablet 2   No current facility-administered medications for this visit.    Allergies:   Macrobid [nitrofurantoin monohyd macro], Nitrofurantoin, and Sulfamethoxazole-trimethoprim   Social History:  The patient  reports that she has been smoking cigarettes. She has a 10 pack-year smoking history. She has never used smokeless tobacco. She reports that she does not drink alcohol and does not use drugs.   Family History:   family history includes Diabetes in her father and mother; Heart attack in her father; Heart disease in her mother; Liver disease in her son; Valvular heart disease in her father.    Review of Systems: Review of Systems  Constitutional: Negative.   HENT: Negative.    Respiratory: Negative.    Cardiovascular: Negative.   Gastrointestinal: Negative.   Musculoskeletal: Negative.   Neurological: Negative.   Psychiatric/Behavioral: Negative.    All other systems reviewed and are negative.   PHYSICAL EXAM: VS:  BP 120/68 (BP Location: Left Arm, Patient Position: Sitting, Cuff Size: Normal)   Pulse 62   Ht 5\' 6"  (1.676 m)   Wt 144 lb 4 oz (65.4 kg)   SpO2 97%   BMI 23.28 kg/m  , BMI Body mass index is 23.28 kg/m. GEN: Well nourished, well developed, in no acute distress HEENT: normal Neck: no JVD, carotid bruits, or  masses Cardiac: RRR; no murmurs, rubs, or gallops,no edema  Respiratory:  clear to auscultation bilaterally, normal work of breathing GI: soft, nontender, nondistended, + BS MS: no deformity or atrophy Skin: warm and dry, no rash Neuro:  Strength and sensation are intact Psych: euthymic mood, full affect  Recent Labs: 10/03/2023: ALT 17; BUN 13; Creatinine, Ser 0.58; Hemoglobin 12.9; Platelets 241; Potassium 4.2; Sodium 142; TSH 2.930    Lipid Panel Lab Results  Component Value Date   CHOL 149 10/03/2023   HDL 53 10/03/2023   LDLCALC 80 10/03/2023   TRIG 82 10/03/2023      Wt Readings from Last 3 Encounters:  01/08/24 144 lb 4 oz (65.4 kg)  10/03/23 139 lb 12.8 oz (63.4 kg)  11/21/22 132 lb (59.9 kg)     ASSESSMENT AND PLAN:  Problem List Items Addressed This Visit       Cardiology Problems   CAD (coronary artery disease) - Primary   Relevant Medications   nitroGLYCERIN (NITROSTAT) 0.4  MG SL tablet   Other Relevant Orders   EKG 12-Lead (Completed)     Other   Dyslipidemia   Tobacco use   Pre-diabetes   Other Visit Diagnoses       History of non-ST elevation myocardial infarction (NSTEMI)       Relevant Orders   EKG 12-Lead (Completed)       CAD with stable angina Prior cardiac catheterization results reviewed detailing 50% mid LAD disease Smoking cessation recommended More aggressive management of hyperlipidemia as below Stressed the importance of low carbohydrate diet to get A1c down Currently with no symptoms of angina. No further workup at this time.  We will hold isosorbide given headaches, if she develops chest pain may need Myoview  Smoking We have encouraged her to continue to work on weaning her cigarettes and smoking cessation. She will continue to work on this and does not want any assistance with chantix.    Hyperlipidemia Continue Crestor 40 daily add Zetia 10 mg daily  Prediabetes A1c 6.3 Recommended low carbohydrate diet We have  encouraged continued exercise, careful diet management    Signed, Dossie Arbour, M.D., Ph.D. [facility] [place], [place] [phone]

## 2024-01-08 ENCOUNTER — Ambulatory Visit: Payer: 59 | Attending: Cardiovascular Disease | Admitting: Cardiovascular Disease

## 2024-01-08 ENCOUNTER — Encounter: Payer: Self-pay | Admitting: Cardiovascular Disease

## 2024-01-08 VITALS — BP 120/68 | HR 62 | Ht 66.0 in | Wt 144.2 lb

## 2024-01-08 DIAGNOSIS — R7303 Prediabetes: Secondary | ICD-10-CM

## 2024-01-08 DIAGNOSIS — Z720 Tobacco use: Secondary | ICD-10-CM

## 2024-01-08 DIAGNOSIS — I25118 Atherosclerotic heart disease of native coronary artery with other forms of angina pectoris: Secondary | ICD-10-CM

## 2024-01-08 DIAGNOSIS — E785 Hyperlipidemia, unspecified: Secondary | ICD-10-CM

## 2024-01-08 DIAGNOSIS — I252 Old myocardial infarction: Secondary | ICD-10-CM

## 2024-01-08 MED ORDER — NITROGLYCERIN 0.4 MG SL SUBL: 0.4000 mg | SUBLINGUAL_TABLET | SUBLINGUAL | 2 refills | Status: AC | PRN

## 2024-01-08 MED ORDER — EZETIMIBE 10 MG PO TABS: 10.0000 mg | ORAL_TABLET | Freq: Every day | ORAL | 3 refills | Status: AC

## 2024-01-08 MED ORDER — ROSUVASTATIN CALCIUM 40 MG PO TABS: 40.0000 mg | ORAL_TABLET | Freq: Every day | ORAL | 3 refills | Status: DC

## 2024-01-08 NOTE — Patient Instructions (Addendum)
 Medication Instructions:  Please start zetia 10 mg daily  Please stop the isosorbide  If you need a refill on your cardiac medications before your next appointment, please call your pharmacy.   Lab work: No new labs needed  Testing/Procedures: No new testing needed  Follow-Up: At [facility], you and your health needs are our priority.  As part of our continuing mission to provide you with exceptional heart care, we have created designated Provider Care Teams.  These Care Teams include your primary Cardiologist (physician) and Advanced Practice Providers (APPs -  Physician Assistants and Nurse Practitioners) who all work together to provide you with the care you need, when you need it.  You will need a follow up appointment in 12 months  Providers on your designated Care Team:   Nicolasa Ducking, NP Eula Listen, PA-C Cadence Fransico Michael, [place]  COVID-19 Vaccine Information can be found at: [email or web address] For questions related to vaccine distribution or appointments, please email [email or web address] or call [phone].

## 2024-03-03 ENCOUNTER — Other Ambulatory Visit: Payer: Self-pay | Admitting: Obstetrics and Gynecology

## 2024-08-19 ENCOUNTER — Other Ambulatory Visit (HOSPITAL_COMMUNITY): Payer: Self-pay | Admitting: Family Medicine

## 2024-08-19 DIAGNOSIS — Z1231 Encounter for screening mammogram for malignant neoplasm of breast: Secondary | ICD-10-CM

## 2024-09-12 ENCOUNTER — Other Ambulatory Visit: Payer: Self-pay | Admitting: Cardiovascular Disease
